# Patient Record
Sex: Male | Born: 1935 | Race: Black or African American | Hispanic: No | Marital: Single
Health system: Southern US, Community
[De-identification: ages and names within clinical notes are randomized; demographics above are authoritative.]

---

## 2013-09-17 ENCOUNTER — Emergency Department: Payer: Self-pay | Admitting: Emergency Medicine

## 2013-09-17 LAB — BASIC METABOLIC PANEL
BUN: 45 mg/dL — ABNORMAL HIGH (ref 7–18)
Calcium, Total: 9.2 mg/dL (ref 8.5–10.1)
Chloride: 110 mmol/L — ABNORMAL HIGH (ref 98–107)
Co2: 23 mmol/L (ref 21–32)
Creatinine: 3.71 mg/dL — ABNORMAL HIGH (ref 0.60–1.30)
EGFR (Non-African Amer.): 15 — ABNORMAL LOW
Osmolality: 297 (ref 275–301)
Potassium: 3.6 mmol/L (ref 3.5–5.1)
Sodium: 141 mmol/L (ref 136–145)

## 2013-09-17 LAB — CBC WITH DIFFERENTIAL/PLATELET
Basophil #: 0 10*3/uL (ref 0.0–0.1)
Eosinophil #: 0 10*3/uL (ref 0.0–0.7)
Eosinophil %: 0 %
HCT: 31.5 % — ABNORMAL LOW (ref 40.0–52.0)
HGB: 10.5 g/dL — ABNORMAL LOW (ref 13.0–18.0)
Lymphocyte %: 7.9 %
MCH: 28.6 pg (ref 26.0–34.0)
MCV: 86 fL (ref 80–100)
Neutrophil #: 5.9 10*3/uL (ref 1.4–6.5)
Neutrophil %: 83.5 %
Platelet: 138 10*3/uL — ABNORMAL LOW (ref 150–440)
RBC: 3.68 10*6/uL — ABNORMAL LOW (ref 4.40–5.90)
WBC: 7 10*3/uL (ref 3.8–10.6)

## 2013-09-17 LAB — URINALYSIS, COMPLETE
Bilirubin,UR: NEGATIVE
Glucose,UR: 50 mg/dL (ref 0–75)
Ketone: NEGATIVE
Leukocyte Esterase: NEGATIVE
Protein: 100
RBC,UR: 5333 /HPF (ref 0–5)

## 2013-09-17 LAB — PROTIME-INR: Prothrombin Time: 33.8 secs — ABNORMAL HIGH (ref 11.5–14.7)

## 2013-12-24 ENCOUNTER — Emergency Department: Payer: Self-pay | Admitting: Emergency Medicine

## 2014-02-21 ENCOUNTER — Ambulatory Visit: Payer: Self-pay | Admitting: Family Medicine

## 2014-02-21 ENCOUNTER — Inpatient Hospital Stay: Payer: Self-pay | Admitting: Internal Medicine

## 2014-02-21 LAB — COMPREHENSIVE METABOLIC PANEL
Albumin: 2.9 g/dL — ABNORMAL LOW (ref 3.4–5.0)
Alkaline Phosphatase: 128 U/L — ABNORMAL HIGH
Anion Gap: 6 — ABNORMAL LOW (ref 7–16)
BUN: 46 mg/dL — ABNORMAL HIGH (ref 7–18)
Bilirubin,Total: 0.6 mg/dL (ref 0.2–1.0)
CALCIUM: 8.6 mg/dL (ref 8.5–10.1)
CHLORIDE: 114 mmol/L — AB (ref 98–107)
CO2: 22 mmol/L (ref 21–32)
CREATININE: 2.77 mg/dL — AB (ref 0.60–1.30)
EGFR (African American): 24 — ABNORMAL LOW
EGFR (Non-African Amer.): 21 — ABNORMAL LOW
Glucose: 145 mg/dL — ABNORMAL HIGH (ref 65–99)
Osmolality: 298 (ref 275–301)
POTASSIUM: 4.7 mmol/L (ref 3.5–5.1)
SGOT(AST): 22 U/L (ref 15–37)
SGPT (ALT): 16 U/L (ref 12–78)
Sodium: 142 mmol/L (ref 136–145)
Total Protein: 7.9 g/dL (ref 6.4–8.2)

## 2014-02-21 LAB — CBC
HCT: 27.9 % — ABNORMAL LOW (ref 40.0–52.0)
HGB: 8.9 g/dL — ABNORMAL LOW (ref 13.0–18.0)
MCH: 27.2 pg (ref 26.0–34.0)
MCHC: 31.8 g/dL — ABNORMAL LOW (ref 32.0–36.0)
MCV: 86 fL (ref 80–100)
Platelet: 192 10*3/uL (ref 150–440)
RBC: 3.25 10*6/uL — ABNORMAL LOW (ref 4.40–5.90)
RDW: 19.6 % — ABNORMAL HIGH (ref 11.5–14.5)
WBC: 6 10*3/uL (ref 3.8–10.6)

## 2014-02-21 LAB — PROTIME-INR
INR: 1.7
Prothrombin Time: 19.2 secs — ABNORMAL HIGH (ref 11.5–14.7)

## 2014-02-21 LAB — APTT: ACTIVATED PTT: 40 s — AB (ref 23.6–35.9)

## 2014-02-22 LAB — BASIC METABOLIC PANEL
Anion Gap: 7 (ref 7–16)
BUN: 48 mg/dL — AB (ref 7–18)
CHLORIDE: 114 mmol/L — AB (ref 98–107)
Calcium, Total: 8.9 mg/dL (ref 8.5–10.1)
Co2: 21 mmol/L (ref 21–32)
Creatinine: 2.67 mg/dL — ABNORMAL HIGH (ref 0.60–1.30)
EGFR (African American): 25 — ABNORMAL LOW
EGFR (Non-African Amer.): 22 — ABNORMAL LOW
GLUCOSE: 165 mg/dL — AB (ref 65–99)
Osmolality: 299 (ref 275–301)
Potassium: 5 mmol/L (ref 3.5–5.1)
Sodium: 142 mmol/L (ref 136–145)

## 2014-02-22 LAB — CBC WITH DIFFERENTIAL/PLATELET
BASOS ABS: 0 10*3/uL (ref 0.0–0.1)
BASOS PCT: 0.8 %
Basophil #: 0 10*3/uL (ref 0.0–0.1)
Basophil %: 0.5 %
EOS ABS: 0 10*3/uL (ref 0.0–0.7)
Eosinophil #: 0 10*3/uL (ref 0.0–0.7)
Eosinophil %: 0.6 %
Eosinophil %: 0.5 %
HCT: 29 % — ABNORMAL LOW (ref 40.0–52.0)
HCT: 27.1 % — AB (ref 40.0–52.0)
HGB: 9.2 g/dL — ABNORMAL LOW (ref 13.0–18.0)
HGB: 8.5 g/dL — ABNORMAL LOW (ref 13.0–18.0)
LYMPHS PCT: 14.3 %
Lymphocyte #: 0.9 10*3/uL — ABNORMAL LOW (ref 1.0–3.6)
Lymphocyte #: 0.7 10*3/uL — ABNORMAL LOW (ref 1.0–3.6)
Lymphocyte %: 12.6 %
MCH: 27.1 pg (ref 26.0–34.0)
MCH: 27.2 pg (ref 26.0–34.0)
MCHC: 31.7 g/dL — AB (ref 32.0–36.0)
MCHC: 31.5 g/dL — ABNORMAL LOW (ref 32.0–36.0)
MCV: 85 fL (ref 80–100)
MCV: 86 fL (ref 80–100)
MONOS PCT: 9.1 %
MONOS PCT: 8.4 %
Monocyte #: 0.6 x10 3/mm (ref 0.2–1.0)
Monocyte #: 0.5 x10 3/mm (ref 0.2–1.0)
NEUTROS ABS: 4.7 10*3/uL (ref 1.4–6.5)
Neutrophil #: 4.3 10*3/uL (ref 1.4–6.5)
Neutrophil %: 75.2 %
Neutrophil %: 78 %
PLATELETS: 171 10*3/uL (ref 150–440)
Platelet: 190 10*3/uL (ref 150–440)
RBC: 3.39 10*6/uL — ABNORMAL LOW (ref 4.40–5.90)
RBC: 3.14 10*6/uL — AB (ref 4.40–5.90)
RDW: 19.5 % — AB (ref 11.5–14.5)
RDW: 19.6 % — AB (ref 11.5–14.5)
WBC: 6.2 10*3/uL (ref 3.8–10.6)
WBC: 5.5 10*3/uL (ref 3.8–10.6)

## 2014-02-22 LAB — APTT
Activated PTT: 42.5 secs — ABNORMAL HIGH (ref 23.6–35.9)
Activated PTT: >160 secs (ref 23.6–35.9)

## 2014-02-23 DIAGNOSIS — I359 Nonrheumatic aortic valve disorder, unspecified: Secondary | ICD-10-CM

## 2014-02-23 LAB — CBC WITH DIFFERENTIAL/PLATELET
BASOS ABS: 0 10*3/uL (ref 0.0–0.1)
Basophil %: 0.5 %
EOS ABS: 0.1 10*3/uL (ref 0.0–0.7)
Eosinophil %: 0.9 %
HCT: 26 % — ABNORMAL LOW (ref 40.0–52.0)
HGB: 8.3 g/dL — ABNORMAL LOW (ref 13.0–18.0)
LYMPHS PCT: 12.6 %
Lymphocyte #: 0.9 10*3/uL — ABNORMAL LOW (ref 1.0–3.6)
MCH: 27.6 pg (ref 26.0–34.0)
MCHC: 31.8 g/dL — ABNORMAL LOW (ref 32.0–36.0)
MCV: 87 fL (ref 80–100)
Monocyte #: 0.6 x10 3/mm (ref 0.2–1.0)
Monocyte %: 9 %
NEUTROS ABS: 5.3 10*3/uL (ref 1.4–6.5)
Neutrophil %: 77 %
PLATELETS: 172 10*3/uL (ref 150–440)
RBC: 3 10*6/uL — ABNORMAL LOW (ref 4.40–5.90)
RDW: 19.3 % — ABNORMAL HIGH (ref 11.5–14.5)
WBC: 6.9 10*3/uL (ref 3.8–10.6)

## 2014-02-23 LAB — PROTIME-INR
INR: 1.9
Prothrombin Time: 21.5 secs — ABNORMAL HIGH (ref 11.5–14.7)

## 2014-02-23 LAB — BASIC METABOLIC PANEL
Anion Gap: 13 (ref 7–16)
BUN: 48 mg/dL — ABNORMAL HIGH (ref 7–18)
CALCIUM: 8.2 mg/dL — AB (ref 8.5–10.1)
CREATININE: 2.61 mg/dL — AB (ref 0.60–1.30)
Chloride: 117 mmol/L — ABNORMAL HIGH (ref 98–107)
Co2: 14 mmol/L — ABNORMAL LOW (ref 21–32)
EGFR (African American): 26 — ABNORMAL LOW
GFR CALC NON AF AMER: 22 — AB
Glucose: 206 mg/dL — ABNORMAL HIGH (ref 65–99)
Osmolality: 305 (ref 275–301)
Potassium: 5.3 mmol/L — ABNORMAL HIGH (ref 3.5–5.1)
SODIUM: 144 mmol/L (ref 136–145)

## 2014-02-23 LAB — APTT: ACTIVATED PTT: 66.6 s — AB (ref 23.6–35.9)

## 2014-02-24 LAB — CBC WITH DIFFERENTIAL/PLATELET
BASOS ABS: 0 10*3/uL (ref 0.0–0.1)
BASOS PCT: 0.2 %
EOS PCT: 0.5 %
Eosinophil #: 0 10*3/uL (ref 0.0–0.7)
HCT: 24.6 % — ABNORMAL LOW (ref 40.0–52.0)
HGB: 8 g/dL — ABNORMAL LOW (ref 13.0–18.0)
LYMPHS ABS: 0.7 10*3/uL — AB (ref 1.0–3.6)
Lymphocyte %: 7.9 %
MCH: 27.5 pg (ref 26.0–34.0)
MCHC: 32.4 g/dL (ref 32.0–36.0)
MCV: 85 fL (ref 80–100)
Monocyte #: 0.8 x10 3/mm (ref 0.2–1.0)
Monocyte %: 9.7 %
Neutrophil #: 6.8 10*3/uL — ABNORMAL HIGH (ref 1.4–6.5)
Neutrophil %: 81.7 %
Platelet: 157 10*3/uL (ref 150–440)
RBC: 2.9 10*6/uL — ABNORMAL LOW (ref 4.40–5.90)
RDW: 18.6 % — ABNORMAL HIGH (ref 11.5–14.5)
WBC: 8.4 10*3/uL (ref 3.8–10.6)

## 2014-02-24 LAB — BASIC METABOLIC PANEL
Anion Gap: 8 (ref 7–16)
BUN: 47 mg/dL — AB (ref 7–18)
CHLORIDE: 114 mmol/L — AB (ref 98–107)
CREATININE: 2.67 mg/dL — AB (ref 0.60–1.30)
Calcium, Total: 8.2 mg/dL — ABNORMAL LOW (ref 8.5–10.1)
Co2: 21 mmol/L (ref 21–32)
EGFR (African American): 25 — ABNORMAL LOW
EGFR (Non-African Amer.): 22 — ABNORMAL LOW
Glucose: 170 mg/dL — ABNORMAL HIGH (ref 65–99)
OSMOLALITY: 301 (ref 275–301)
Potassium: 4.7 mmol/L (ref 3.5–5.1)
SODIUM: 143 mmol/L (ref 136–145)

## 2014-02-24 LAB — APTT
Activated PTT: >160 secs (ref 23.6–35.9)
Activated PTT: 119.4 secs — ABNORMAL HIGH (ref 23.6–35.9)
Activated PTT: 83.6 secs — ABNORMAL HIGH (ref 23.6–35.9)

## 2014-02-24 LAB — PATHOLOGY REPORT

## 2014-02-24 LAB — SEDIMENTATION RATE: ERYTHROCYTE SED RATE: 43 mm/h — AB (ref 0–20)

## 2014-02-25 LAB — CBC WITH DIFFERENTIAL/PLATELET
BASOS PCT: 0.5 %
Basophil #: 0 10*3/uL (ref 0.0–0.1)
EOS PCT: 0.6 %
Eosinophil #: 0 10*3/uL (ref 0.0–0.7)
HCT: 22.5 % — ABNORMAL LOW (ref 40.0–52.0)
HGB: 7.2 g/dL — ABNORMAL LOW (ref 13.0–18.0)
LYMPHS ABS: 1.1 10*3/uL (ref 1.0–3.6)
LYMPHS PCT: 15.6 %
MCH: 27.4 pg (ref 26.0–34.0)
MCHC: 32 g/dL (ref 32.0–36.0)
MCV: 86 fL (ref 80–100)
MONO ABS: 0.8 x10 3/mm (ref 0.2–1.0)
MONOS PCT: 11.4 %
NEUTROS PCT: 71.9 %
Neutrophil #: 4.9 10*3/uL (ref 1.4–6.5)
Platelet: 142 10*3/uL — ABNORMAL LOW (ref 150–440)
RBC: 2.63 10*6/uL — ABNORMAL LOW (ref 4.40–5.90)
RDW: 19.3 % — ABNORMAL HIGH (ref 11.5–14.5)
WBC: 6.8 10*3/uL (ref 3.8–10.6)

## 2014-02-25 LAB — BASIC METABOLIC PANEL
Anion Gap: 8 (ref 7–16)
BUN: 55 mg/dL — ABNORMAL HIGH (ref 7–18)
CALCIUM: 8.5 mg/dL (ref 8.5–10.1)
CHLORIDE: 109 mmol/L — AB (ref 98–107)
Co2: 22 mmol/L (ref 21–32)
Creatinine: 3.47 mg/dL — ABNORMAL HIGH (ref 0.60–1.30)
EGFR (African American): 18 — ABNORMAL LOW
EGFR (Non-African Amer.): 16 — ABNORMAL LOW
GLUCOSE: 173 mg/dL — AB (ref 65–99)
OSMOLALITY: 297 (ref 275–301)
Potassium: 4.6 mmol/L (ref 3.5–5.1)
Sodium: 139 mmol/L (ref 136–145)

## 2014-02-25 LAB — PROTIME-INR
INR: 1.9
PROTHROMBIN TIME: 21 s — AB (ref 11.5–14.7)

## 2014-02-25 LAB — VANCOMYCIN, TROUGH: Vancomycin, Trough: 17 ug/mL (ref 10–20)

## 2014-02-26 LAB — CULTURE, BLOOD (SINGLE)

## 2014-02-26 LAB — BASIC METABOLIC PANEL
Anion Gap: 9 (ref 7–16)
BUN: 62 mg/dL — ABNORMAL HIGH (ref 7–18)
CO2: 21 mmol/L (ref 21–32)
CREATININE: 3.89 mg/dL — AB (ref 0.60–1.30)
Calcium, Total: 8.4 mg/dL — ABNORMAL LOW (ref 8.5–10.1)
Chloride: 109 mmol/L — ABNORMAL HIGH (ref 98–107)
EGFR (African American): 16 — ABNORMAL LOW
EGFR (Non-African Amer.): 14 — ABNORMAL LOW
GLUCOSE: 143 mg/dL — AB (ref 65–99)
OSMOLALITY: 298 (ref 275–301)
POTASSIUM: 5 mmol/L (ref 3.5–5.1)
Sodium: 139 mmol/L (ref 136–145)

## 2014-02-26 LAB — CBC WITH DIFFERENTIAL/PLATELET
BASOS ABS: 0 10*3/uL (ref 0.0–0.1)
Basophil %: 0.4 %
EOS ABS: 0.1 10*3/uL (ref 0.0–0.7)
Eosinophil %: 1 %
HCT: 25.7 % — AB (ref 40.0–52.0)
HGB: 8.1 g/dL — ABNORMAL LOW (ref 13.0–18.0)
Lymphocyte #: 1 10*3/uL (ref 1.0–3.6)
Lymphocyte %: 14.6 %
MCH: 27.1 pg (ref 26.0–34.0)
MCHC: 31.6 g/dL — ABNORMAL LOW (ref 32.0–36.0)
MCV: 86 fL (ref 80–100)
MONO ABS: 0.7 x10 3/mm (ref 0.2–1.0)
MONOS PCT: 10.5 %
Neutrophil #: 4.9 10*3/uL (ref 1.4–6.5)
Neutrophil %: 73.5 %
PLATELETS: 146 10*3/uL — AB (ref 150–440)
RBC: 2.99 10*6/uL — AB (ref 4.40–5.90)
RDW: 19.1 % — AB (ref 11.5–14.5)
WBC: 6.6 10*3/uL (ref 3.8–10.6)

## 2014-02-26 LAB — PROTIME-INR
INR: 1.9
PROTHROMBIN TIME: 21.2 s — AB (ref 11.5–14.7)

## 2014-02-26 LAB — APTT: Activated PTT: 100.8 secs — ABNORMAL HIGH (ref 23.6–35.9)

## 2014-02-27 LAB — OCCULT BLOOD X 1 CARD TO LAB, STOOL: Occult Blood, Feces: POSITIVE

## 2014-02-27 LAB — BASIC METABOLIC PANEL
Anion Gap: 10 (ref 7–16)
BUN: 70 mg/dL — ABNORMAL HIGH (ref 7–18)
CO2: 19 mmol/L — AB (ref 21–32)
CREATININE: 4.16 mg/dL — AB (ref 0.60–1.30)
Calcium, Total: 8.5 mg/dL (ref 8.5–10.1)
Chloride: 108 mmol/L — ABNORMAL HIGH (ref 98–107)
EGFR (Non-African Amer.): 13 — ABNORMAL LOW
GFR CALC AF AMER: 15 — AB
Glucose: 146 mg/dL — ABNORMAL HIGH (ref 65–99)
Osmolality: 297 (ref 275–301)
POTASSIUM: 5.2 mmol/L — AB (ref 3.5–5.1)
SODIUM: 137 mmol/L (ref 136–145)

## 2014-02-27 LAB — PROTIME-INR
INR: 1.9
Prothrombin Time: 21.4 secs — ABNORMAL HIGH (ref 11.5–14.7)

## 2014-02-27 LAB — CBC WITH DIFFERENTIAL/PLATELET
Basophil #: 0 10*3/uL (ref 0.0–0.1)
Basophil %: 0.5 %
Eosinophil #: 0.1 10*3/uL (ref 0.0–0.7)
Eosinophil %: 1 %
HCT: 25.5 % — AB (ref 40.0–52.0)
HGB: 8 g/dL — AB (ref 13.0–18.0)
LYMPHS ABS: 0.8 10*3/uL — AB (ref 1.0–3.6)
LYMPHS PCT: 13.4 %
MCH: 26.9 pg (ref 26.0–34.0)
MCHC: 31.6 g/dL — AB (ref 32.0–36.0)
MCV: 85 fL (ref 80–100)
Monocyte #: 0.7 x10 3/mm (ref 0.2–1.0)
Monocyte %: 11.2 %
Neutrophil #: 4.5 10*3/uL (ref 1.4–6.5)
Neutrophil %: 73.9 %
Platelet: 142 10*3/uL — ABNORMAL LOW (ref 150–440)
RBC: 2.99 10*6/uL — AB (ref 4.40–5.90)
RDW: 19.2 % — ABNORMAL HIGH (ref 11.5–14.5)
WBC: 6.1 10*3/uL (ref 3.8–10.6)

## 2014-02-27 LAB — APTT
ACTIVATED PTT: 113.8 s — AB (ref 23.6–35.9)
ACTIVATED PTT: 67.2 s — AB (ref 23.6–35.9)
Activated PTT: >160 secs (ref 23.6–35.9)

## 2014-02-28 ENCOUNTER — Ambulatory Visit: Payer: Self-pay

## 2014-02-28 LAB — BASIC METABOLIC PANEL
Anion Gap: 11 (ref 7–16)
BUN: 71 mg/dL — ABNORMAL HIGH (ref 7–18)
CALCIUM: 7.7 mg/dL — AB (ref 8.5–10.1)
CHLORIDE: 114 mmol/L — AB (ref 98–107)
Co2: 17 mmol/L — ABNORMAL LOW (ref 21–32)
Creatinine: 3.83 mg/dL — ABNORMAL HIGH (ref 0.60–1.30)
EGFR (African American): 16 — ABNORMAL LOW
GFR CALC NON AF AMER: 14 — AB
Glucose: 180 mg/dL — ABNORMAL HIGH (ref 65–99)
OSMOLALITY: 308 (ref 275–301)
POTASSIUM: 4.8 mmol/L (ref 3.5–5.1)
Sodium: 142 mmol/L (ref 136–145)

## 2014-02-28 LAB — CBC WITH DIFFERENTIAL/PLATELET
BASOS PCT: 0.5 %
Basophil #: 0 10*3/uL (ref 0.0–0.1)
Eosinophil #: 0 10*3/uL (ref 0.0–0.7)
Eosinophil %: 0.6 %
HCT: 26.7 % — AB (ref 40.0–52.0)
HGB: 8.5 g/dL — ABNORMAL LOW (ref 13.0–18.0)
Lymphocyte #: 0.7 10*3/uL — ABNORMAL LOW (ref 1.0–3.6)
Lymphocyte %: 10.9 %
MCH: 27.3 pg (ref 26.0–34.0)
MCHC: 32 g/dL (ref 32.0–36.0)
MCV: 86 fL (ref 80–100)
MONO ABS: 0.7 x10 3/mm (ref 0.2–1.0)
Monocyte %: 10.3 %
Neutrophil #: 4.9 10*3/uL (ref 1.4–6.5)
Neutrophil %: 77.7 %
Platelet: 139 10*3/uL — ABNORMAL LOW (ref 150–440)
RBC: 3.12 10*6/uL — ABNORMAL LOW (ref 4.40–5.90)
RDW: 19.2 % — AB (ref 11.5–14.5)
WBC: 6.3 10*3/uL (ref 3.8–10.6)

## 2014-02-28 LAB — LIPID PANEL
Cholesterol: 110 mg/dL (ref 0–200)
HDL: 54 mg/dL (ref 40–60)
Ldl Cholesterol, Calc: 44 mg/dL (ref 0–100)
Triglycerides: 58 mg/dL (ref 0–200)
VLDL CHOLESTEROL, CALC: 12 mg/dL (ref 5–40)

## 2014-02-28 LAB — PROTIME-INR
INR: 2
Prothrombin Time: 22 secs — ABNORMAL HIGH (ref 11.5–14.7)

## 2014-02-28 LAB — APTT: Activated PTT: 65.5 secs — ABNORMAL HIGH (ref 23.6–35.9)

## 2014-02-28 LAB — PHOSPHORUS: PHOSPHORUS: 4.8 mg/dL (ref 2.5–4.9)

## 2014-02-28 LAB — WOUND CULTURE

## 2014-03-01 LAB — CBC WITH DIFFERENTIAL/PLATELET
BASOS PCT: 0.7 %
Basophil #: 0 10*3/uL (ref 0.0–0.1)
EOS ABS: 0.1 10*3/uL (ref 0.0–0.7)
Eosinophil %: 1.3 %
HCT: 24.6 % — ABNORMAL LOW (ref 40.0–52.0)
HGB: 7.7 g/dL — ABNORMAL LOW (ref 13.0–18.0)
Lymphocyte #: 0.6 10*3/uL — ABNORMAL LOW (ref 1.0–3.6)
Lymphocyte %: 12.1 %
MCH: 26.7 pg (ref 26.0–34.0)
MCHC: 31.3 g/dL — AB (ref 32.0–36.0)
MCV: 85 fL (ref 80–100)
Monocyte #: 0.7 x10 3/mm (ref 0.2–1.0)
Monocyte %: 12.9 %
Neutrophil #: 3.8 10*3/uL (ref 1.4–6.5)
Neutrophil %: 73 %
Platelet: 132 10*3/uL — ABNORMAL LOW (ref 150–440)
RBC: 2.88 10*6/uL — AB (ref 4.40–5.90)
RDW: 19.4 % — AB (ref 11.5–14.5)
WBC: 5.2 10*3/uL (ref 3.8–10.6)

## 2014-03-01 LAB — BASIC METABOLIC PANEL
ANION GAP: 7 (ref 7–16)
BUN: 72 mg/dL — AB (ref 7–18)
CO2: 24 mmol/L (ref 21–32)
CREATININE: 3.71 mg/dL — AB (ref 0.60–1.30)
Calcium, Total: 8.4 mg/dL — ABNORMAL LOW (ref 8.5–10.1)
Chloride: 110 mmol/L — ABNORMAL HIGH (ref 98–107)
EGFR (African American): 17 — ABNORMAL LOW
GFR CALC NON AF AMER: 15 — AB
GLUCOSE: 123 mg/dL — AB (ref 65–99)
Osmolality: 304 (ref 275–301)
POTASSIUM: 4.7 mmol/L (ref 3.5–5.1)
Sodium: 141 mmol/L (ref 136–145)

## 2014-03-01 LAB — IRON AND TIBC
IRON SATURATION: 8 %
Iron Bind.Cap.(Total): 322 ug/dL (ref 250–450)
Iron: 25 ug/dL — ABNORMAL LOW (ref 65–175)
UNBOUND IRON-BIND. CAP.: 297 ug/dL

## 2014-03-01 LAB — ALBUMIN: ALBUMIN: 2.5 g/dL — AB (ref 3.4–5.0)

## 2014-03-01 LAB — PROTIME-INR
INR: 2.4
Prothrombin Time: 25.9 secs — ABNORMAL HIGH (ref 11.5–14.7)

## 2014-03-01 LAB — FERRITIN: Ferritin (ARMC): 67 ng/mL (ref 8–388)

## 2014-03-02 DIAGNOSIS — I4949 Other premature depolarization: Secondary | ICD-10-CM

## 2014-03-02 DIAGNOSIS — I4891 Unspecified atrial fibrillation: Secondary | ICD-10-CM

## 2014-03-02 LAB — CBC WITH DIFFERENTIAL/PLATELET
BASOS ABS: 0 10*3/uL (ref 0.0–0.1)
Basophil %: 0.5 %
Eosinophil #: 0.1 10*3/uL (ref 0.0–0.7)
Eosinophil %: 0.9 %
HCT: 26.1 % — ABNORMAL LOW (ref 40.0–52.0)
HGB: 8.6 g/dL — ABNORMAL LOW (ref 13.0–18.0)
LYMPHS ABS: 1 10*3/uL (ref 1.0–3.6)
LYMPHS PCT: 15 %
MCH: 27.8 pg (ref 26.0–34.0)
MCHC: 33 g/dL (ref 32.0–36.0)
MCV: 84 fL (ref 80–100)
MONO ABS: 1 x10 3/mm (ref 0.2–1.0)
MONOS PCT: 15.6 %
NEUTROS PCT: 68 %
Neutrophil #: 4.4 10*3/uL (ref 1.4–6.5)
Platelet: 113 10*3/uL — ABNORMAL LOW (ref 150–440)
RBC: 3.1 10*6/uL — ABNORMAL LOW (ref 4.40–5.90)
RDW: 18.1 % — AB (ref 11.5–14.5)
WBC: 6.4 10*3/uL (ref 3.8–10.6)

## 2014-03-02 LAB — BASIC METABOLIC PANEL
Anion Gap: 9 (ref 7–16)
BUN: 56 mg/dL — AB (ref 7–18)
CALCIUM: 8.4 mg/dL — AB (ref 8.5–10.1)
CHLORIDE: 108 mmol/L — AB (ref 98–107)
CO2: 24 mmol/L (ref 21–32)
Creatinine: 3.49 mg/dL — ABNORMAL HIGH (ref 0.60–1.30)
GFR CALC AF AMER: 18 — AB
GFR CALC NON AF AMER: 16 — AB
Glucose: 133 mg/dL — ABNORMAL HIGH (ref 65–99)
OSMOLALITY: 299 (ref 275–301)
POTASSIUM: 4.3 mmol/L (ref 3.5–5.1)
SODIUM: 141 mmol/L (ref 136–145)

## 2014-03-02 LAB — MAGNESIUM: MAGNESIUM: 2.2 mg/dL

## 2014-03-02 LAB — PROTIME-INR
INR: 2.9
PROTHROMBIN TIME: 29.2 s — AB (ref 11.5–14.7)

## 2014-03-02 LAB — PHOSPHORUS: Phosphorus: 3.6 mg/dL (ref 2.5–4.9)

## 2014-03-03 LAB — CBC WITH DIFFERENTIAL/PLATELET
Basophil #: 0 10*3/uL (ref 0.0–0.1)
Basophil %: 0.5 %
Eosinophil #: 0.1 10*3/uL (ref 0.0–0.7)
Eosinophil %: 1.7 %
HCT: 26 % — AB (ref 40.0–52.0)
HGB: 8.5 g/dL — ABNORMAL LOW (ref 13.0–18.0)
Lymphocyte #: 0.7 10*3/uL — ABNORMAL LOW (ref 1.0–3.6)
Lymphocyte %: 11.1 %
MCH: 27.8 pg (ref 26.0–34.0)
MCHC: 32.5 g/dL (ref 32.0–36.0)
MCV: 85 fL (ref 80–100)
MONOS PCT: 15.5 %
Monocyte #: 1 x10 3/mm (ref 0.2–1.0)
Neutrophil #: 4.8 10*3/uL (ref 1.4–6.5)
Neutrophil %: 71.2 %
Platelet: 104 10*3/uL — ABNORMAL LOW (ref 150–440)
RBC: 3.05 10*6/uL — ABNORMAL LOW (ref 4.40–5.90)
RDW: 18.4 % — ABNORMAL HIGH (ref 11.5–14.5)
WBC: 6.7 10*3/uL (ref 3.8–10.6)

## 2014-03-03 LAB — BASIC METABOLIC PANEL
ANION GAP: 5 — AB (ref 7–16)
BUN: 40 mg/dL — ABNORMAL HIGH (ref 7–18)
CALCIUM: 8.2 mg/dL — AB (ref 8.5–10.1)
Chloride: 105 mmol/L (ref 98–107)
Co2: 29 mmol/L (ref 21–32)
Creatinine: 2.94 mg/dL — ABNORMAL HIGH (ref 0.60–1.30)
EGFR (African American): 22 — ABNORMAL LOW
GFR CALC NON AF AMER: 19 — AB
Glucose: 126 mg/dL — ABNORMAL HIGH (ref 65–99)
OSMOLALITY: 289 (ref 275–301)
Potassium: 4.1 mmol/L (ref 3.5–5.1)
Sodium: 139 mmol/L (ref 136–145)

## 2014-03-03 LAB — PROTIME-INR
INR: 2.6
Prothrombin Time: 27.3 secs — ABNORMAL HIGH (ref 11.5–14.7)

## 2014-03-04 LAB — CBC WITH DIFFERENTIAL/PLATELET
Basophil #: 0 10*3/uL (ref 0.0–0.1)
Basophil %: 0.3 %
EOS ABS: 0.1 10*3/uL (ref 0.0–0.7)
Eosinophil %: 0.8 %
HCT: 26.1 % — ABNORMAL LOW (ref 40.0–52.0)
HGB: 8.5 g/dL — ABNORMAL LOW (ref 13.0–18.0)
Lymphocyte #: 0.5 10*3/uL — ABNORMAL LOW (ref 1.0–3.6)
Lymphocyte %: 6.6 %
MCH: 28 pg (ref 26.0–34.0)
MCHC: 32.7 g/dL (ref 32.0–36.0)
MCV: 86 fL (ref 80–100)
MONO ABS: 1 x10 3/mm (ref 0.2–1.0)
Monocyte %: 12.1 %
NEUTROS ABS: 6.5 10*3/uL (ref 1.4–6.5)
NEUTROS PCT: 80.2 %
Platelet: 106 10*3/uL — ABNORMAL LOW (ref 150–440)
RBC: 3.05 10*6/uL — ABNORMAL LOW (ref 4.40–5.90)
RDW: 18.8 % — ABNORMAL HIGH (ref 11.5–14.5)
WBC: 8.2 10*3/uL (ref 3.8–10.6)

## 2014-03-04 LAB — POTASSIUM: Potassium: 3.6 mmol/L (ref 3.5–5.1)

## 2014-03-04 LAB — BASIC METABOLIC PANEL
Anion Gap: 6 — ABNORMAL LOW (ref 7–16)
BUN: 34 mg/dL — AB (ref 7–18)
CO2: 30 mmol/L (ref 21–32)
Calcium, Total: 8.6 mg/dL (ref 8.5–10.1)
Chloride: 102 mmol/L (ref 98–107)
Creatinine: 3 mg/dL — ABNORMAL HIGH (ref 0.60–1.30)
EGFR (African American): 22 — ABNORMAL LOW
GFR CALC NON AF AMER: 19 — AB
GLUCOSE: 218 mg/dL — AB (ref 65–99)
OSMOLALITY: 290 (ref 275–301)
Potassium: 3.9 mmol/L (ref 3.5–5.1)
SODIUM: 138 mmol/L (ref 136–145)

## 2014-03-04 LAB — MAGNESIUM: Magnesium: 2 mg/dL

## 2014-03-04 LAB — PROTIME-INR
INR: 1.8
PROTHROMBIN TIME: 20.4 s — AB (ref 11.5–14.7)

## 2014-03-04 LAB — PHOSPHORUS: PHOSPHORUS: 3.1 mg/dL (ref 2.5–4.9)

## 2014-03-05 LAB — PROTIME-INR
INR: 1.4
PROTHROMBIN TIME: 17.3 s — AB (ref 11.5–14.7)

## 2014-03-06 LAB — BASIC METABOLIC PANEL
Anion Gap: 5 — ABNORMAL LOW (ref 7–16)
BUN: 38 mg/dL — ABNORMAL HIGH (ref 7–18)
Calcium, Total: 8.5 mg/dL (ref 8.5–10.1)
Chloride: 101 mmol/L (ref 98–107)
Co2: 32 mmol/L (ref 21–32)
Creatinine: 4.21 mg/dL — ABNORMAL HIGH (ref 0.60–1.30)
EGFR (African American): 15 — ABNORMAL LOW
EGFR (Non-African Amer.): 13 — ABNORMAL LOW
Glucose: 138 mg/dL — ABNORMAL HIGH (ref 65–99)
OSMOLALITY: 287 (ref 275–301)
Potassium: 3.8 mmol/L (ref 3.5–5.1)
SODIUM: 138 mmol/L (ref 136–145)

## 2014-03-06 LAB — PROTIME-INR
INR: 1.4
PROTHROMBIN TIME: 17.2 s — AB (ref 11.5–14.7)

## 2014-03-07 ENCOUNTER — Ambulatory Visit: Payer: Self-pay

## 2014-03-07 LAB — PROTIME-INR
INR: 1.4
Prothrombin Time: 17.3 secs — ABNORMAL HIGH (ref 11.5–14.7)

## 2014-03-07 LAB — CLOSTRIDIUM DIFFICILE(ARMC)

## 2014-03-07 LAB — HEMOGLOBIN: HGB: 8.4 g/dL — ABNORMAL LOW (ref 13.0–18.0)

## 2014-03-08 LAB — BASIC METABOLIC PANEL
ANION GAP: 6 — AB (ref 7–16)
BUN: 39 mg/dL — ABNORMAL HIGH (ref 7–18)
CHLORIDE: 101 mmol/L (ref 98–107)
Calcium, Total: 8.5 mg/dL (ref 8.5–10.1)
Co2: 33 mmol/L — ABNORMAL HIGH (ref 21–32)
Creatinine: 3.87 mg/dL — ABNORMAL HIGH (ref 0.60–1.30)
EGFR (African American): 16 — ABNORMAL LOW
EGFR (Non-African Amer.): 14 — ABNORMAL LOW
Glucose: 154 mg/dL — ABNORMAL HIGH (ref 65–99)
Osmolality: 292 (ref 275–301)
Potassium: 3.7 mmol/L (ref 3.5–5.1)
Sodium: 140 mmol/L (ref 136–145)

## 2014-03-08 LAB — PROTIME-INR
INR: 1.7
Prothrombin Time: 19.2 secs — ABNORMAL HIGH (ref 11.5–14.7)

## 2014-03-08 LAB — HEMOGLOBIN: HGB: 8.8 g/dL — ABNORMAL LOW (ref 13.0–18.0)

## 2014-03-09 LAB — CBC WITH DIFFERENTIAL/PLATELET
BASOS PCT: 0.8 %
Basophil #: 0.1 10*3/uL (ref 0.0–0.1)
Eosinophil #: 0.3 10*3/uL (ref 0.0–0.7)
Eosinophil %: 4 %
HCT: 26.1 % — AB (ref 40.0–52.0)
HGB: 8.2 g/dL — AB (ref 13.0–18.0)
Lymphocyte #: 0.8 10*3/uL — ABNORMAL LOW (ref 1.0–3.6)
Lymphocyte %: 10.1 %
MCH: 27.1 pg (ref 26.0–34.0)
MCHC: 31.5 g/dL — ABNORMAL LOW (ref 32.0–36.0)
MCV: 86 fL (ref 80–100)
Monocyte #: 0.7 x10 3/mm (ref 0.2–1.0)
Monocyte %: 9.1 %
NEUTROS ABS: 5.7 10*3/uL (ref 1.4–6.5)
NEUTROS PCT: 76 %
PLATELETS: 153 10*3/uL (ref 150–440)
RBC: 3.04 10*6/uL — ABNORMAL LOW (ref 4.40–5.90)
RDW: 19.4 % — ABNORMAL HIGH (ref 11.5–14.5)
WBC: 7.5 10*3/uL (ref 3.8–10.6)

## 2014-03-09 LAB — PRO B NATRIURETIC PEPTIDE: B-Type Natriuretic Peptide: 6002 pg/mL — ABNORMAL HIGH (ref 0–450)

## 2014-03-09 LAB — PROTIME-INR
INR: 2.1
Prothrombin Time: 23.1 secs — ABNORMAL HIGH (ref 11.5–14.7)

## 2014-03-09 LAB — PHOSPHORUS: Phosphorus: 3.6 mg/dL (ref 2.5–4.9)

## 2014-03-10 LAB — BASIC METABOLIC PANEL
Anion Gap: 5 — ABNORMAL LOW (ref 7–16)
BUN: 27 mg/dL — ABNORMAL HIGH (ref 7–18)
CALCIUM: 8.2 mg/dL — AB (ref 8.5–10.1)
CHLORIDE: 100 mmol/L (ref 98–107)
CO2: 35 mmol/L — AB (ref 21–32)
CREATININE: 2.88 mg/dL — AB (ref 0.60–1.30)
EGFR (African American): 23 — ABNORMAL LOW
EGFR (Non-African Amer.): 20 — ABNORMAL LOW
Glucose: 159 mg/dL — ABNORMAL HIGH (ref 65–99)
Osmolality: 288 (ref 275–301)
Potassium: 3.6 mmol/L (ref 3.5–5.1)
Sodium: 140 mmol/L (ref 136–145)

## 2014-03-10 LAB — PROTIME-INR
INR: 2.1
Prothrombin Time: 23.3 secs — ABNORMAL HIGH (ref 11.5–14.7)

## 2014-03-11 LAB — BASIC METABOLIC PANEL
ANION GAP: 6 — AB (ref 7–16)
BUN: 35 mg/dL — AB (ref 7–18)
CHLORIDE: 100 mmol/L (ref 98–107)
CO2: 34 mmol/L — AB (ref 21–32)
CREATININE: 3.54 mg/dL — AB (ref 0.60–1.30)
Calcium, Total: 8.3 mg/dL — ABNORMAL LOW (ref 8.5–10.1)
EGFR (African American): 18 — ABNORMAL LOW
EGFR (Non-African Amer.): 15 — ABNORMAL LOW
Glucose: 154 mg/dL — ABNORMAL HIGH (ref 65–99)
Osmolality: 290 (ref 275–301)
Potassium: 3.7 mmol/L (ref 3.5–5.1)
Sodium: 140 mmol/L (ref 136–145)

## 2014-03-11 LAB — PROTIME-INR
INR: 2.3
Prothrombin Time: 24.8 secs — ABNORMAL HIGH (ref 11.5–14.7)

## 2014-03-11 LAB — HEMOGLOBIN: HGB: 8.1 g/dL — AB (ref 13.0–18.0)

## 2014-03-12 LAB — PROTIME-INR
INR: 2.6
Prothrombin Time: 27.3 secs — ABNORMAL HIGH (ref 11.5–14.7)

## 2014-03-12 LAB — CREATININE, SERUM
CREATININE: 3.54 mg/dL — AB (ref 0.60–1.30)
EGFR (African American): 18 — ABNORMAL LOW
EGFR (Non-African Amer.): 15 — ABNORMAL LOW

## 2014-03-12 LAB — HEMOGLOBIN: HGB: 8 g/dL — AB (ref 13.0–18.0)

## 2014-03-13 ENCOUNTER — Encounter: Payer: Self-pay | Admitting: Internal Medicine

## 2014-03-13 LAB — BASIC METABOLIC PANEL
ANION GAP: 5 — AB (ref 7–16)
BUN: 49 mg/dL — AB (ref 7–18)
CALCIUM: 8.3 mg/dL — AB (ref 8.5–10.1)
CO2: 33 mmol/L — AB (ref 21–32)
CREATININE: 3.66 mg/dL — AB (ref 0.60–1.30)
Chloride: 100 mmol/L (ref 98–107)
EGFR (Non-African Amer.): 15 — ABNORMAL LOW
GFR CALC AF AMER: 17 — AB
Glucose: 124 mg/dL — ABNORMAL HIGH (ref 65–99)
OSMOLALITY: 290 (ref 275–301)
POTASSIUM: 4 mmol/L (ref 3.5–5.1)
SODIUM: 138 mmol/L (ref 136–145)

## 2014-03-13 LAB — PROTIME-INR
INR: 2.9
PROTHROMBIN TIME: 29.4 s — AB (ref 11.5–14.7)

## 2014-03-17 LAB — BASIC METABOLIC PANEL
Anion Gap: 6 — ABNORMAL LOW (ref 7–16)
BUN: 67 mg/dL — ABNORMAL HIGH (ref 7–18)
CHLORIDE: 104 mmol/L (ref 98–107)
Calcium, Total: 8.3 mg/dL — ABNORMAL LOW (ref 8.5–10.1)
Co2: 29 mmol/L (ref 21–32)
Creatinine: 3.86 mg/dL — ABNORMAL HIGH (ref 0.60–1.30)
EGFR (African American): 16 — ABNORMAL LOW
GFR CALC NON AF AMER: 14 — AB
Glucose: 133 mg/dL — ABNORMAL HIGH (ref 65–99)
OSMOLALITY: 299 (ref 275–301)
POTASSIUM: 4.1 mmol/L (ref 3.5–5.1)
SODIUM: 139 mmol/L (ref 136–145)

## 2014-03-17 LAB — CBC WITH DIFFERENTIAL/PLATELET
Basophil #: 0 10*3/uL (ref 0.0–0.1)
Basophil %: 0.7 %
Eosinophil #: 0.2 10*3/uL (ref 0.0–0.7)
Eosinophil %: 2.6 %
HCT: 23.1 % — ABNORMAL LOW (ref 40.0–52.0)
HGB: 7.5 g/dL — AB (ref 13.0–18.0)
LYMPHS ABS: 1.1 10*3/uL (ref 1.0–3.6)
Lymphocyte %: 19 %
MCH: 27.4 pg (ref 26.0–34.0)
MCHC: 32.4 g/dL (ref 32.0–36.0)
MCV: 84 fL (ref 80–100)
MONOS PCT: 13.5 %
Monocyte #: 0.8 x10 3/mm (ref 0.2–1.0)
NEUTROS ABS: 3.8 10*3/uL (ref 1.4–6.5)
Neutrophil %: 64.2 %
Platelet: 190 10*3/uL (ref 150–440)
RBC: 2.73 10*6/uL — ABNORMAL LOW (ref 4.40–5.90)
RDW: 18.1 % — ABNORMAL HIGH (ref 11.5–14.5)
WBC: 5.9 10*3/uL (ref 3.8–10.6)

## 2014-03-17 LAB — PROTIME-INR
INR: 3.2
PROTHROMBIN TIME: 31.8 s — AB (ref 11.5–14.7)

## 2014-03-17 LAB — HEMOGLOBIN A1C: Hemoglobin A1C: 7.6 % — ABNORMAL HIGH (ref 4.2–6.3)

## 2014-03-19 LAB — CBC WITH DIFFERENTIAL/PLATELET
BASOS PCT: 0.9 %
Basophil #: 0 10*3/uL (ref 0.0–0.1)
EOS ABS: 0.1 10*3/uL (ref 0.0–0.7)
EOS PCT: 2.1 %
HCT: 23.7 % — ABNORMAL LOW (ref 40.0–52.0)
HGB: 7.7 g/dL — ABNORMAL LOW (ref 13.0–18.0)
LYMPHS ABS: 0.9 10*3/uL — AB (ref 1.0–3.6)
Lymphocyte %: 18.6 %
MCH: 27.5 pg (ref 26.0–34.0)
MCHC: 32.7 g/dL (ref 32.0–36.0)
MCV: 84 fL (ref 80–100)
MONO ABS: 0.7 x10 3/mm (ref 0.2–1.0)
Monocyte %: 13.1 %
Neutrophil #: 3.3 10*3/uL (ref 1.4–6.5)
Neutrophil %: 65.3 %
PLATELETS: 202 10*3/uL (ref 150–440)
RBC: 2.82 10*6/uL — ABNORMAL LOW (ref 4.40–5.90)
RDW: 18 % — AB (ref 11.5–14.5)
WBC: 5 10*3/uL (ref 3.8–10.6)

## 2014-03-19 LAB — BASIC METABOLIC PANEL
Anion Gap: 8 (ref 7–16)
BUN: 71 mg/dL — ABNORMAL HIGH (ref 7–18)
CHLORIDE: 102 mmol/L (ref 98–107)
CO2: 27 mmol/L (ref 21–32)
CREATININE: 3.85 mg/dL — AB (ref 0.60–1.30)
Calcium, Total: 8.3 mg/dL — ABNORMAL LOW (ref 8.5–10.1)
EGFR (Non-African Amer.): 14 — ABNORMAL LOW
GFR CALC AF AMER: 16 — AB
Glucose: 164 mg/dL — ABNORMAL HIGH (ref 65–99)
Osmolality: 298 (ref 275–301)
Potassium: 4 mmol/L (ref 3.5–5.1)
Sodium: 137 mmol/L (ref 136–145)

## 2014-03-19 LAB — PROTIME-INR
INR: 2.9
Prothrombin Time: 29.7 secs — ABNORMAL HIGH (ref 11.5–14.7)

## 2014-03-20 LAB — URINALYSIS, COMPLETE
Specific Gravity: 1.009 (ref 1.003–1.030)
Squamous Epithelial: NONE SEEN

## 2014-03-23 LAB — URINE CULTURE

## 2014-03-24 LAB — UR PROT ELECTROPHORESIS, URINE RANDOM

## 2014-03-30 ENCOUNTER — Ambulatory Visit: Payer: Self-pay | Admitting: Oncology

## 2014-04-12 ENCOUNTER — Inpatient Hospital Stay: Payer: Self-pay | Admitting: Internal Medicine

## 2014-04-12 LAB — CBC
HCT: 16.9 % — ABNORMAL LOW (ref 40.0–52.0)
HCT: 17.2 % — ABNORMAL LOW (ref 40.0–52.0)
HGB: 5.4 g/dL — ABNORMAL LOW (ref 13.0–18.0)
HGB: 5.4 g/dL — AB (ref 13.0–18.0)
MCH: 26.4 pg (ref 26.0–34.0)
MCH: 26.5 pg (ref 26.0–34.0)
MCHC: 31.7 g/dL — AB (ref 32.0–36.0)
MCHC: 31.5 g/dL — AB (ref 32.0–36.0)
MCV: 83 fL (ref 80–100)
MCV: 84 fL (ref 80–100)
PLATELETS: 144 10*3/uL — AB (ref 150–440)
Platelet: 141 10*3/uL — ABNORMAL LOW (ref 150–440)
RBC: 2.03 10*6/uL — AB (ref 4.40–5.90)
RBC: 2.04 10*6/uL — ABNORMAL LOW (ref 4.40–5.90)
RDW: 18.6 % — ABNORMAL HIGH (ref 11.5–14.5)
RDW: 18.6 % — ABNORMAL HIGH (ref 11.5–14.5)
WBC: 4.9 10*3/uL (ref 3.8–10.6)
WBC: 5.1 10*3/uL (ref 3.8–10.6)

## 2014-04-12 LAB — COMPREHENSIVE METABOLIC PANEL
ALBUMIN: 2.7 g/dL — AB (ref 3.4–5.0)
Alkaline Phosphatase: 89 U/L
Anion Gap: 8 (ref 7–16)
BUN: 117 mg/dL — AB (ref 7–18)
Bilirubin,Total: 0.4 mg/dL (ref 0.2–1.0)
CO2: 18 mmol/L — AB (ref 21–32)
CREATININE: 4.8 mg/dL — AB (ref 0.60–1.30)
Calcium, Total: 8 mg/dL — ABNORMAL LOW (ref 8.5–10.1)
Chloride: 116 mmol/L — ABNORMAL HIGH (ref 98–107)
EGFR (African American): 12 — ABNORMAL LOW
EGFR (Non-African Amer.): 11 — ABNORMAL LOW
Glucose: 143 mg/dL — ABNORMAL HIGH (ref 65–99)
OSMOLALITY: 323 (ref 275–301)
POTASSIUM: 4.9 mmol/L (ref 3.5–5.1)
SGOT(AST): 21 U/L (ref 15–37)
SGPT (ALT): 11 U/L — ABNORMAL LOW (ref 12–78)
Sodium: 142 mmol/L (ref 136–145)
TOTAL PROTEIN: 7.2 g/dL (ref 6.4–8.2)

## 2014-04-12 LAB — URINALYSIS, COMPLETE
Bacteria: NONE SEEN
Bilirubin,UR: NEGATIVE
KETONE: NEGATIVE
LEUKOCYTE ESTERASE: NEGATIVE
Nitrite: NEGATIVE
PH: 6 (ref 4.5–8.0)
Protein: 100
SQUAMOUS EPITHELIAL: NONE SEEN
Specific Gravity: 1.014 (ref 1.003–1.030)
WBC UR: 7 /HPF (ref 0–5)

## 2014-04-12 LAB — TROPONIN I: Troponin-I: 0.07 ng/mL — ABNORMAL HIGH

## 2014-04-12 LAB — PROTIME-INR
INR: 3.4
PROTHROMBIN TIME: 33.1 s — AB (ref 11.5–14.7)

## 2014-04-12 LAB — PRO B NATRIURETIC PEPTIDE: B-TYPE NATIURETIC PEPTID: 8925 pg/mL — AB (ref 0–450)

## 2014-04-13 DIAGNOSIS — N186 End stage renal disease: Secondary | ICD-10-CM

## 2014-04-13 DIAGNOSIS — I509 Heart failure, unspecified: Secondary | ICD-10-CM

## 2014-04-13 DIAGNOSIS — I5032 Chronic diastolic (congestive) heart failure: Secondary | ICD-10-CM

## 2014-04-13 DIAGNOSIS — Z7901 Long term (current) use of anticoagulants: Secondary | ICD-10-CM

## 2014-04-13 DIAGNOSIS — I4891 Unspecified atrial fibrillation: Secondary | ICD-10-CM

## 2014-04-13 LAB — CBC WITH DIFFERENTIAL/PLATELET
Basophil #: 0 10*3/uL (ref 0.0–0.1)
Basophil %: 0.6 %
EOS PCT: 0.8 %
Eosinophil #: 0 10*3/uL (ref 0.0–0.7)
HCT: 20.9 % — ABNORMAL LOW (ref 40.0–52.0)
HGB: 6.6 g/dL — ABNORMAL LOW (ref 13.0–18.0)
LYMPHS PCT: 14.6 %
Lymphocyte #: 0.6 10*3/uL — ABNORMAL LOW (ref 1.0–3.6)
MCH: 26.8 pg (ref 26.0–34.0)
MCHC: 31.6 g/dL — ABNORMAL LOW (ref 32.0–36.0)
MCV: 85 fL (ref 80–100)
Monocyte #: 0.5 x10 3/mm (ref 0.2–1.0)
Monocyte %: 10.8 %
NEUTROS ABS: 3.2 10*3/uL (ref 1.4–6.5)
NEUTROS PCT: 73.2 %
PLATELETS: 135 10*3/uL — AB (ref 150–440)
RBC: 2.46 10*6/uL — ABNORMAL LOW (ref 4.40–5.90)
RDW: 17.9 % — AB (ref 11.5–14.5)
WBC: 4.4 10*3/uL (ref 3.8–10.6)

## 2014-04-13 LAB — BASIC METABOLIC PANEL
ANION GAP: 7 (ref 7–16)
BUN: 114 mg/dL — ABNORMAL HIGH (ref 7–18)
Calcium, Total: 8 mg/dL — ABNORMAL LOW (ref 8.5–10.1)
Chloride: 115 mmol/L — ABNORMAL HIGH (ref 98–107)
Co2: 19 mmol/L — ABNORMAL LOW (ref 21–32)
Creatinine: 4.67 mg/dL — ABNORMAL HIGH (ref 0.60–1.30)
EGFR (African American): 13 — ABNORMAL LOW
EGFR (Non-African Amer.): 11 — ABNORMAL LOW
GLUCOSE: 167 mg/dL — AB (ref 65–99)
OSMOLALITY: 321 (ref 275–301)
Potassium: 4.9 mmol/L (ref 3.5–5.1)
SODIUM: 141 mmol/L (ref 136–145)

## 2014-04-13 LAB — HEMOGLOBIN A1C: Hemoglobin A1C: 7.3 % — ABNORMAL HIGH (ref 4.2–6.3)

## 2014-04-13 LAB — TROPONIN I: TROPONIN-I: 0.06 ng/mL — AB

## 2014-04-13 LAB — OCCULT BLOOD X 1 CARD TO LAB, STOOL: OCCULT BLOOD, FECES: POSITIVE

## 2014-04-13 LAB — PROTIME-INR
INR: 3
PROTHROMBIN TIME: 30.2 s — AB (ref 11.5–14.7)

## 2014-04-13 LAB — MAGNESIUM: Magnesium: 2.4 mg/dL

## 2014-04-13 LAB — PHOSPHORUS: PHOSPHORUS: 5.5 mg/dL — AB (ref 2.5–4.9)

## 2014-04-14 LAB — CBC WITH DIFFERENTIAL/PLATELET
Basophil #: 0 10*3/uL (ref 0.0–0.1)
Basophil #: 0 10*3/uL (ref 0.0–0.1)
Basophil %: 0.4 %
Basophil %: 0.4 %
EOS PCT: 2.5 %
EOS PCT: 2.2 %
Eosinophil #: 0.1 10*3/uL (ref 0.0–0.7)
Eosinophil #: 0.1 10*3/uL (ref 0.0–0.7)
HCT: 24.5 % — AB (ref 40.0–52.0)
HCT: 24.1 % — ABNORMAL LOW (ref 40.0–52.0)
HGB: 7.8 g/dL — ABNORMAL LOW (ref 13.0–18.0)
HGB: 7.9 g/dL — ABNORMAL LOW (ref 13.0–18.0)
LYMPHS ABS: 0.9 10*3/uL — AB (ref 1.0–3.6)
Lymphocyte #: 0.8 10*3/uL — ABNORMAL LOW (ref 1.0–3.6)
Lymphocyte %: 15 %
Lymphocyte %: 12 %
MCH: 26.6 pg (ref 26.0–34.0)
MCH: 27.6 pg (ref 26.0–34.0)
MCHC: 31.9 g/dL — ABNORMAL LOW (ref 32.0–36.0)
MCHC: 32.9 g/dL (ref 32.0–36.0)
MCV: 83 fL (ref 80–100)
MCV: 84 fL (ref 80–100)
MONOS PCT: 10.7 %
Monocyte #: 0.7 x10 3/mm (ref 0.2–1.0)
Monocyte #: 0.7 x10 3/mm (ref 0.2–1.0)
Monocyte %: 11.7 %
NEUTROS ABS: 4.7 10*3/uL (ref 1.4–6.5)
NEUTROS PCT: 74.7 %
Neutrophil #: 4.1 10*3/uL (ref 1.4–6.5)
Neutrophil %: 70.4 %
PLATELETS: 91 10*3/uL — AB (ref 150–440)
Platelet: 85 10*3/uL — ABNORMAL LOW (ref 150–440)
RBC: 2.94 10*6/uL — ABNORMAL LOW (ref 4.40–5.90)
RBC: 2.87 10*6/uL — ABNORMAL LOW (ref 4.40–5.90)
RDW: 17.8 % — ABNORMAL HIGH (ref 11.5–14.5)
RDW: 18.3 % — ABNORMAL HIGH (ref 11.5–14.5)
WBC: 5.9 10*3/uL (ref 3.8–10.6)
WBC: 6.3 10*3/uL (ref 3.8–10.6)

## 2014-04-14 LAB — RENAL FUNCTION PANEL
ALBUMIN: 2.9 g/dL — AB (ref 3.4–5.0)
Anion Gap: 8 (ref 7–16)
BUN: 98 mg/dL — AB (ref 7–18)
CREATININE: 4.41 mg/dL — AB (ref 0.60–1.30)
Calcium, Total: 7.7 mg/dL — ABNORMAL LOW (ref 8.5–10.1)
Chloride: 113 mmol/L — ABNORMAL HIGH (ref 98–107)
Co2: 20 mmol/L — ABNORMAL LOW (ref 21–32)
EGFR (African American): 14 — ABNORMAL LOW
GFR CALC NON AF AMER: 12 — AB
Glucose: 160 mg/dL — ABNORMAL HIGH (ref 65–99)
Osmolality: 315 (ref 275–301)
PHOSPHORUS: 4.8 mg/dL (ref 2.5–4.9)
Potassium: 4.5 mmol/L (ref 3.5–5.1)
Sodium: 141 mmol/L (ref 136–145)

## 2014-04-14 LAB — CK TOTAL AND CKMB (NOT AT ARMC)
CK, Total: 132 U/L
CK-MB: 3.1 ng/mL (ref 0.5–3.6)

## 2014-04-14 LAB — PROTIME-INR
INR: 4.3
Prothrombin Time: 39.8 secs — ABNORMAL HIGH (ref 11.5–14.7)

## 2014-04-14 LAB — MAGNESIUM: Magnesium: 2.5 mg/dL — ABNORMAL HIGH

## 2014-04-15 LAB — CBC WITH DIFFERENTIAL/PLATELET
BASOS ABS: 0 10*3/uL (ref 0.0–0.1)
Basophil %: 0.5 %
Eosinophil #: 0.2 10*3/uL (ref 0.0–0.7)
Eosinophil %: 2.7 %
HCT: 23.7 % — ABNORMAL LOW (ref 40.0–52.0)
HGB: 7.8 g/dL — ABNORMAL LOW (ref 13.0–18.0)
Lymphocyte #: 1 10*3/uL (ref 1.0–3.6)
Lymphocyte %: 15.7 %
MCH: 27.7 pg (ref 26.0–34.0)
MCHC: 32.8 g/dL (ref 32.0–36.0)
MCV: 84 fL (ref 80–100)
Monocyte #: 0.8 x10 3/mm (ref 0.2–1.0)
Monocyte %: 12.4 %
Neutrophil #: 4.5 10*3/uL (ref 1.4–6.5)
Neutrophil %: 68.7 %
PLATELETS: 51 10*3/uL — AB (ref 150–440)
RBC: 2.81 10*6/uL — ABNORMAL LOW (ref 4.40–5.90)
RDW: 17.7 % — ABNORMAL HIGH (ref 11.5–14.5)
WBC: 6.6 10*3/uL (ref 3.8–10.6)

## 2014-04-15 LAB — PROTIME-INR
INR: 3
Prothrombin Time: 30.5 secs — ABNORMAL HIGH (ref 11.5–14.7)

## 2014-04-16 DIAGNOSIS — N186 End stage renal disease: Secondary | ICD-10-CM

## 2014-04-16 DIAGNOSIS — I5032 Chronic diastolic (congestive) heart failure: Secondary | ICD-10-CM

## 2014-04-16 DIAGNOSIS — I4891 Unspecified atrial fibrillation: Secondary | ICD-10-CM

## 2014-04-16 DIAGNOSIS — I509 Heart failure, unspecified: Secondary | ICD-10-CM

## 2014-04-16 LAB — CBC WITH DIFFERENTIAL/PLATELET
BASOS ABS: 0 10*3/uL (ref 0.0–0.1)
Basophil %: 0.3 %
EOS ABS: 0.1 10*3/uL (ref 0.0–0.7)
Eosinophil %: 1.3 %
HCT: 22.9 % — AB (ref 40.0–52.0)
HGB: 7.4 g/dL — ABNORMAL LOW (ref 13.0–18.0)
Lymphocyte #: 1 10*3/uL (ref 1.0–3.6)
Lymphocyte %: 13.9 %
MCH: 27.3 pg (ref 26.0–34.0)
MCHC: 32.5 g/dL (ref 32.0–36.0)
MCV: 84 fL (ref 80–100)
MONO ABS: 1 x10 3/mm (ref 0.2–1.0)
Monocyte %: 13.7 %
NEUTROS PCT: 70.8 %
Neutrophil #: 5 10*3/uL (ref 1.4–6.5)
Platelet: 41 10*3/uL — ABNORMAL LOW (ref 150–440)
RBC: 2.72 10*6/uL — AB (ref 4.40–5.90)
RDW: 17.9 % — AB (ref 11.5–14.5)
WBC: 7.1 10*3/uL (ref 3.8–10.6)

## 2014-04-16 LAB — PROTIME-INR
INR: 2.5
Prothrombin Time: 26.6 secs — ABNORMAL HIGH (ref 11.5–14.7)

## 2014-04-17 LAB — CBC WITH DIFFERENTIAL/PLATELET
Basophil #: 0 10*3/uL (ref 0.0–0.1)
Basophil #: 0 10*3/uL (ref 0.0–0.1)
Basophil %: 0.4 %
Basophil %: 0.5 %
Eosinophil #: 0.1 10*3/uL (ref 0.0–0.7)
Eosinophil #: 0.1 10*3/uL (ref 0.0–0.7)
Eosinophil %: 1 %
Eosinophil %: 1.7 %
HCT: 20.6 % — ABNORMAL LOW (ref 40.0–52.0)
HCT: 21.2 % — ABNORMAL LOW (ref 40.0–52.0)
HGB: 6.7 g/dL — ABNORMAL LOW (ref 13.0–18.0)
HGB: 6.9 g/dL — AB (ref 13.0–18.0)
LYMPHS ABS: 1.1 10*3/uL (ref 1.0–3.6)
Lymphocyte #: 1 10*3/uL (ref 1.0–3.6)
Lymphocyte %: 16.5 %
Lymphocyte %: 14.5 %
MCH: 27.5 pg (ref 26.0–34.0)
MCH: 27.8 pg (ref 26.0–34.0)
MCHC: 32.5 g/dL (ref 32.0–36.0)
MCHC: 32.7 g/dL (ref 32.0–36.0)
MCV: 85 fL (ref 80–100)
MCV: 85 fL (ref 80–100)
Monocyte #: 1 x10 3/mm (ref 0.2–1.0)
Monocyte #: 0.9 x10 3/mm (ref 0.2–1.0)
Monocyte %: 14.6 %
Monocyte %: 12.2 %
NEUTROS ABS: 4.4 10*3/uL (ref 1.4–6.5)
NEUTROS ABS: 5 10*3/uL (ref 1.4–6.5)
Neutrophil %: 67.5 %
Neutrophil %: 71.1 %
Platelet: 27 10*3/uL — CL (ref 150–440)
Platelet: 32 10*3/uL — ABNORMAL LOW (ref 150–440)
RBC: 2.44 10*6/uL — ABNORMAL LOW (ref 4.40–5.90)
RBC: 2.49 10*6/uL — ABNORMAL LOW (ref 4.40–5.90)
RDW: 18 % — AB (ref 11.5–14.5)
RDW: 17.7 % — AB (ref 11.5–14.5)
WBC: 6.5 10*3/uL (ref 3.8–10.6)
WBC: 7 10*3/uL (ref 3.8–10.6)

## 2014-04-17 LAB — HEMOGLOBIN: HGB: 7.4 g/dL — ABNORMAL LOW (ref 13.0–18.0)

## 2014-04-17 LAB — PHOSPHORUS: PHOSPHORUS: 2.4 mg/dL — AB (ref 2.5–4.9)

## 2014-04-17 LAB — PROTIME-INR
INR: 2.3
Prothrombin Time: 24.7 secs — ABNORMAL HIGH (ref 11.5–14.7)

## 2014-04-17 LAB — POTASSIUM: POTASSIUM: 4 mmol/L (ref 3.5–5.1)

## 2014-04-18 LAB — BASIC METABOLIC PANEL
Anion Gap: 5 — ABNORMAL LOW (ref 7–16)
BUN: 54 mg/dL — ABNORMAL HIGH (ref 7–18)
CHLORIDE: 106 mmol/L (ref 98–107)
CO2: 31 mmol/L (ref 21–32)
CREATININE: 2.8 mg/dL — AB (ref 0.60–1.30)
Calcium, Total: 8.2 mg/dL — ABNORMAL LOW (ref 8.5–10.1)
EGFR (African American): 24 — ABNORMAL LOW
EGFR (Non-African Amer.): 21 — ABNORMAL LOW
Glucose: 126 mg/dL — ABNORMAL HIGH (ref 65–99)
OSMOLALITY: 299 (ref 275–301)
POTASSIUM: 3.8 mmol/L (ref 3.5–5.1)
Sodium: 142 mmol/L (ref 136–145)

## 2014-04-18 LAB — CBC WITH DIFFERENTIAL/PLATELET
Basophil #: 0 10*3/uL (ref 0.0–0.1)
Basophil #: 0 10*3/uL (ref 0.0–0.1)
Basophil %: 0.4 %
Basophil %: 0.5 %
EOS ABS: 0.1 10*3/uL (ref 0.0–0.7)
EOS PCT: 1.7 %
Eosinophil #: 0.2 10*3/uL (ref 0.0–0.7)
Eosinophil %: 2.1 %
HCT: 23.3 % — ABNORMAL LOW (ref 40.0–52.0)
HCT: 22.8 % — ABNORMAL LOW (ref 40.0–52.0)
HGB: 7.5 g/dL — ABNORMAL LOW (ref 13.0–18.0)
HGB: 7.3 g/dL — AB (ref 13.0–18.0)
LYMPHS ABS: 1 10*3/uL (ref 1.0–3.6)
LYMPHS ABS: 1.1 10*3/uL (ref 1.0–3.6)
Lymphocyte %: 13.4 %
Lymphocyte %: 16.6 %
MCH: 27.8 pg (ref 26.0–34.0)
MCH: 27.9 pg (ref 26.0–34.0)
MCHC: 32.3 g/dL (ref 32.0–36.0)
MCHC: 32.3 g/dL (ref 32.0–36.0)
MCV: 86 fL (ref 80–100)
MCV: 87 fL (ref 80–100)
MONO ABS: 0.8 x10 3/mm (ref 0.2–1.0)
MONOS PCT: 11.1 %
MONOS PCT: 12.4 %
Monocyte #: 0.8 x10 3/mm (ref 0.2–1.0)
NEUTROS ABS: 5.5 10*3/uL (ref 1.4–6.5)
NEUTROS PCT: 73 %
Neutrophil #: 4.6 10*3/uL (ref 1.4–6.5)
Neutrophil %: 68.8 %
Platelet: 43 10*3/uL — ABNORMAL LOW (ref 150–440)
Platelet: 43 10*3/uL — ABNORMAL LOW (ref 150–440)
RBC: 2.71 10*6/uL — ABNORMAL LOW (ref 4.40–5.90)
RBC: 2.63 10*6/uL — ABNORMAL LOW (ref 4.40–5.90)
RDW: 17.6 % — ABNORMAL HIGH (ref 11.5–14.5)
RDW: 17.6 % — AB (ref 11.5–14.5)
WBC: 7.6 10*3/uL (ref 3.8–10.6)
WBC: 6.7 10*3/uL (ref 3.8–10.6)

## 2014-04-18 LAB — MAGNESIUM: MAGNESIUM: 2 mg/dL

## 2014-04-18 LAB — CULTURE, BLOOD (SINGLE)

## 2014-04-18 LAB — PROTIME-INR
INR: 1.5
Prothrombin Time: 17.8 secs — ABNORMAL HIGH (ref 11.5–14.7)

## 2014-04-18 LAB — PHOSPHORUS: Phosphorus: 2.1 mg/dL — ABNORMAL LOW (ref 2.5–4.9)

## 2014-04-18 LAB — HEMOGLOBIN: HGB: 7.4 g/dL — ABNORMAL LOW (ref 13.0–18.0)

## 2014-04-19 LAB — CBC WITH DIFFERENTIAL/PLATELET
Basophil #: 0 10*3/uL (ref 0.0–0.1)
Basophil %: 0.4 %
EOS PCT: 1.2 %
Eosinophil #: 0.1 10*3/uL (ref 0.0–0.7)
HCT: 24.5 % — AB (ref 40.0–52.0)
HGB: 8 g/dL — ABNORMAL LOW (ref 13.0–18.0)
Lymphocyte #: 1 10*3/uL (ref 1.0–3.6)
Lymphocyte %: 14.7 %
MCH: 28.2 pg (ref 26.0–34.0)
MCHC: 32.6 g/dL (ref 32.0–36.0)
MCV: 86 fL (ref 80–100)
Monocyte #: 0.8 x10 3/mm (ref 0.2–1.0)
Monocyte %: 10.9 %
Neutrophil #: 5 10*3/uL (ref 1.4–6.5)
Neutrophil %: 72.8 %
Platelet: 39 10*3/uL — ABNORMAL LOW (ref 150–440)
RBC: 2.83 10*6/uL — AB (ref 4.40–5.90)
RDW: 17.6 % — AB (ref 11.5–14.5)
WBC: 6.9 10*3/uL (ref 3.8–10.6)

## 2014-04-19 LAB — BASIC METABOLIC PANEL
Anion Gap: 7 (ref 7–16)
BUN: 36 mg/dL — ABNORMAL HIGH (ref 7–18)
CHLORIDE: 105 mmol/L (ref 98–107)
CREATININE: 2.32 mg/dL — AB (ref 0.60–1.30)
Calcium, Total: 8.5 mg/dL (ref 8.5–10.1)
Co2: 31 mmol/L (ref 21–32)
EGFR (African American): 30 — ABNORMAL LOW
EGFR (Non-African Amer.): 26 — ABNORMAL LOW
Glucose: 125 mg/dL — ABNORMAL HIGH (ref 65–99)
OSMOLALITY: 295 (ref 275–301)
Potassium: 3.9 mmol/L (ref 3.5–5.1)
Sodium: 143 mmol/L (ref 136–145)

## 2014-04-19 LAB — MAGNESIUM: MAGNESIUM: 1.8 mg/dL

## 2014-04-20 DIAGNOSIS — I4891 Unspecified atrial fibrillation: Secondary | ICD-10-CM

## 2014-04-20 DIAGNOSIS — I5032 Chronic diastolic (congestive) heart failure: Secondary | ICD-10-CM

## 2014-04-20 LAB — CBC WITH DIFFERENTIAL/PLATELET
BASOS ABS: 0.1 10*3/uL (ref 0.0–0.1)
BASOS ABS: 0 10*3/uL (ref 0.0–0.1)
BASOS PCT: 0.8 %
BASOS PCT: 0.4 %
EOS ABS: 0.1 10*3/uL (ref 0.0–0.7)
Eosinophil #: 0 10*3/uL (ref 0.0–0.7)
Eosinophil %: 1.6 %
Eosinophil %: 0.5 %
HCT: 26.3 % — AB (ref 40.0–52.0)
HCT: 27.3 % — ABNORMAL LOW (ref 40.0–52.0)
HGB: 8.2 g/dL — ABNORMAL LOW (ref 13.0–18.0)
HGB: 8.4 g/dL — AB (ref 13.0–18.0)
LYMPHS ABS: 0.9 10*3/uL — AB (ref 1.0–3.6)
LYMPHS ABS: 0.3 10*3/uL — AB (ref 1.0–3.6)
LYMPHS PCT: 3.6 %
Lymphocyte %: 13.4 %
MCH: 27.4 pg (ref 26.0–34.0)
MCH: 27.2 pg (ref 26.0–34.0)
MCHC: 31.2 g/dL — ABNORMAL LOW (ref 32.0–36.0)
MCHC: 30.9 g/dL — AB (ref 32.0–36.0)
MCV: 88 fL (ref 80–100)
MCV: 88 fL (ref 80–100)
MONO ABS: 0.7 x10 3/mm (ref 0.2–1.0)
MONOS PCT: 11.2 %
MONOS PCT: 8.2 %
Monocyte #: 0.8 x10 3/mm (ref 0.2–1.0)
NEUTROS ABS: 4.8 10*3/uL (ref 1.4–6.5)
NEUTROS ABS: 8.1 10*3/uL — AB (ref 1.4–6.5)
NEUTROS PCT: 73 %
Neutrophil %: 87.3 %
Platelet: 84 10*3/uL — ABNORMAL LOW (ref 150–440)
Platelet: 102 10*3/uL — ABNORMAL LOW (ref 150–440)
RBC: 3 10*6/uL — AB (ref 4.40–5.90)
RBC: 3.1 10*6/uL — AB (ref 4.40–5.90)
RDW: 17.9 % — ABNORMAL HIGH (ref 11.5–14.5)
RDW: 18.4 % — ABNORMAL HIGH (ref 11.5–14.5)
WBC: 6.6 10*3/uL (ref 3.8–10.6)
WBC: 9.3 10*3/uL (ref 3.8–10.6)

## 2014-04-20 LAB — RENAL FUNCTION PANEL
Albumin: 2.6 g/dL — ABNORMAL LOW (ref 3.4–5.0)
Anion Gap: 6 — ABNORMAL LOW (ref 7–16)
BUN: 42 mg/dL — AB (ref 7–18)
CHLORIDE: 105 mmol/L (ref 98–107)
Calcium, Total: 8.7 mg/dL (ref 8.5–10.1)
Co2: 30 mmol/L (ref 21–32)
Creatinine: 2.86 mg/dL — ABNORMAL HIGH (ref 0.60–1.30)
EGFR (African American): 23 — ABNORMAL LOW
EGFR (Non-African Amer.): 20 — ABNORMAL LOW
GLUCOSE: 129 mg/dL — AB (ref 65–99)
Osmolality: 293 (ref 275–301)
PHOSPHORUS: 2.7 mg/dL (ref 2.5–4.9)
Potassium: 4.1 mmol/L (ref 3.5–5.1)
Sodium: 141 mmol/L (ref 136–145)

## 2014-04-20 LAB — PLATELET COUNT: Platelet: 92 10*3/uL — ABNORMAL LOW (ref 150–440)

## 2014-04-20 LAB — PROTIME-INR
INR: 1.4
Prothrombin Time: 16.8 secs — ABNORMAL HIGH (ref 11.5–14.7)

## 2014-04-21 LAB — BASIC METABOLIC PANEL
ANION GAP: 7 (ref 7–16)
BUN: 29 mg/dL — ABNORMAL HIGH (ref 7–18)
CO2: 31 mmol/L (ref 21–32)
Calcium, Total: 8.4 mg/dL — ABNORMAL LOW (ref 8.5–10.1)
Chloride: 104 mmol/L (ref 98–107)
Creatinine: 2.24 mg/dL — ABNORMAL HIGH (ref 0.60–1.30)
EGFR (African American): 31 — ABNORMAL LOW
EGFR (Non-African Amer.): 27 — ABNORMAL LOW
Glucose: 121 mg/dL — ABNORMAL HIGH (ref 65–99)
Osmolality: 290 (ref 275–301)
Potassium: 3.9 mmol/L (ref 3.5–5.1)
SODIUM: 142 mmol/L (ref 136–145)

## 2014-04-21 LAB — CBC WITH DIFFERENTIAL/PLATELET
BASOS ABS: 0 10*3/uL (ref 0.0–0.1)
Basophil #: 0 10*3/uL (ref 0.0–0.1)
Basophil %: 0.4 %
Basophil %: 0.4 %
EOS ABS: 0 10*3/uL (ref 0.0–0.7)
EOS PCT: 1.5 %
Eosinophil #: 0.1 10*3/uL (ref 0.0–0.7)
Eosinophil %: 0.2 %
HCT: 24.6 % — AB (ref 40.0–52.0)
HCT: 24.3 % — ABNORMAL LOW (ref 40.0–52.0)
HGB: 7.7 g/dL — ABNORMAL LOW (ref 13.0–18.0)
HGB: 7.4 g/dL — ABNORMAL LOW (ref 13.0–18.0)
LYMPHS PCT: 5.9 %
Lymphocyte #: 0.7 10*3/uL — ABNORMAL LOW (ref 1.0–3.6)
Lymphocyte #: 0.5 10*3/uL — ABNORMAL LOW (ref 1.0–3.6)
Lymphocyte %: 7.7 %
MCH: 27.6 pg (ref 26.0–34.0)
MCH: 26.8 pg (ref 26.0–34.0)
MCHC: 31.4 g/dL — ABNORMAL LOW (ref 32.0–36.0)
MCHC: 30.5 g/dL — AB (ref 32.0–36.0)
MCV: 88 fL (ref 80–100)
MCV: 88 fL (ref 80–100)
MONO ABS: 0.6 x10 3/mm (ref 0.2–1.0)
MONOS PCT: 7.1 %
MONOS PCT: 8.4 %
Monocyte #: 0.8 x10 3/mm (ref 0.2–1.0)
NEUTROS ABS: 5.5 10*3/uL (ref 1.4–6.5)
NEUTROS PCT: 86.4 %
NEUTROS PCT: 82 %
Neutrophil #: 9.7 10*3/uL — ABNORMAL HIGH (ref 1.4–6.5)
PLATELETS: 40 10*3/uL — AB (ref 150–440)
Platelet: 63 10*3/uL — ABNORMAL LOW (ref 150–440)
RBC: 2.79 10*6/uL — ABNORMAL LOW (ref 4.40–5.90)
RBC: 2.77 10*6/uL — ABNORMAL LOW (ref 4.40–5.90)
RDW: 18.1 % — AB (ref 11.5–14.5)
RDW: 18.3 % — ABNORMAL HIGH (ref 11.5–14.5)
WBC: 11.3 10*3/uL — ABNORMAL HIGH (ref 3.8–10.6)
WBC: 6.7 10*3/uL (ref 3.8–10.6)

## 2014-04-21 LAB — PROTIME-INR
INR: 1.5
Prothrombin Time: 18.1 secs — ABNORMAL HIGH (ref 11.5–14.7)

## 2014-04-21 LAB — HEMOGLOBIN: HGB: 7.3 g/dL — ABNORMAL LOW (ref 13.0–18.0)

## 2014-04-22 LAB — CBC WITH DIFFERENTIAL/PLATELET
Basophil #: 0.1 10*3/uL (ref 0.0–0.1)
Basophil %: 0.7 %
Eosinophil #: 0.1 10*3/uL (ref 0.0–0.7)
Eosinophil %: 0.6 %
HCT: 23.9 % — ABNORMAL LOW (ref 40.0–52.0)
HGB: 7.7 g/dL — ABNORMAL LOW (ref 13.0–18.0)
Lymphocyte #: 0.6 10*3/uL — ABNORMAL LOW (ref 1.0–3.6)
Lymphocyte %: 5.8 %
MCH: 28.2 pg (ref 26.0–34.0)
MCHC: 32.1 g/dL (ref 32.0–36.0)
MCV: 88 fL (ref 80–100)
Monocyte #: 0.7 x10 3/mm (ref 0.2–1.0)
Monocyte %: 6.4 %
Neutrophil #: 9.1 10*3/uL — ABNORMAL HIGH (ref 1.4–6.5)
Neutrophil %: 86.5 %
PLATELETS: 66 10*3/uL — AB (ref 150–440)
RBC: 2.71 10*6/uL — ABNORMAL LOW (ref 4.40–5.90)
RDW: 18.4 % — AB (ref 11.5–14.5)
WBC: 10.5 10*3/uL (ref 3.8–10.6)

## 2014-04-23 LAB — CBC WITH DIFFERENTIAL/PLATELET
BASOS ABS: 0 10*3/uL (ref 0.0–0.1)
BASOS PCT: 0.6 %
EOS PCT: 1.6 %
Eosinophil #: 0.1 10*3/uL (ref 0.0–0.7)
HCT: 23.9 % — AB (ref 40.0–52.0)
HGB: 7.6 g/dL — ABNORMAL LOW (ref 13.0–18.0)
LYMPHS ABS: 0.9 10*3/uL — AB (ref 1.0–3.6)
LYMPHS PCT: 13.6 %
MCH: 28 pg (ref 26.0–34.0)
MCHC: 31.9 g/dL — ABNORMAL LOW (ref 32.0–36.0)
MCV: 88 fL (ref 80–100)
MONO ABS: 0.6 x10 3/mm (ref 0.2–1.0)
Monocyte %: 8.8 %
NEUTROS PCT: 75.4 %
Neutrophil #: 5 10*3/uL (ref 1.4–6.5)
PLATELETS: 64 10*3/uL — AB (ref 150–440)
RBC: 2.71 10*6/uL — ABNORMAL LOW (ref 4.40–5.90)
RDW: 18.6 % — AB (ref 11.5–14.5)
WBC: 6.6 10*3/uL (ref 3.8–10.6)

## 2014-04-23 LAB — BASIC METABOLIC PANEL
Anion Gap: 7 (ref 7–16)
BUN: 26 mg/dL — ABNORMAL HIGH (ref 7–18)
CALCIUM: 8.5 mg/dL (ref 8.5–10.1)
Chloride: 100 mmol/L (ref 98–107)
Co2: 30 mmol/L (ref 21–32)
Creatinine: 2.85 mg/dL — ABNORMAL HIGH (ref 0.60–1.30)
EGFR (African American): 23 — ABNORMAL LOW
GFR CALC NON AF AMER: 20 — AB
GLUCOSE: 151 mg/dL — AB (ref 65–99)
Osmolality: 281 (ref 275–301)
Potassium: 3.8 mmol/L (ref 3.5–5.1)
SODIUM: 137 mmol/L (ref 136–145)

## 2014-04-23 LAB — MAGNESIUM: Magnesium: 1.8 mg/dL

## 2014-04-24 LAB — CBC WITH DIFFERENTIAL/PLATELET
BASOS ABS: 0 10*3/uL (ref 0.0–0.1)
Basophil %: 0.3 %
Eosinophil #: 0 10*3/uL (ref 0.0–0.7)
Eosinophil %: 0.6 %
HCT: 24 % — ABNORMAL LOW (ref 40.0–52.0)
HGB: 7.8 g/dL — ABNORMAL LOW (ref 13.0–18.0)
LYMPHS ABS: 0.9 10*3/uL — AB (ref 1.0–3.6)
Lymphocyte %: 17.8 %
MCH: 28.1 pg (ref 26.0–34.0)
MCHC: 32.3 g/dL (ref 32.0–36.0)
MCV: 87 fL (ref 80–100)
Monocyte #: 0.6 x10 3/mm (ref 0.2–1.0)
Monocyte %: 11.8 %
Neutrophil #: 3.6 10*3/uL (ref 1.4–6.5)
Neutrophil %: 69.5 %
Platelet: 86 10*3/uL — ABNORMAL LOW (ref 150–440)
RBC: 2.76 10*6/uL — AB (ref 4.40–5.90)
RDW: 19.2 % — ABNORMAL HIGH (ref 11.5–14.5)
WBC: 5.1 10*3/uL (ref 3.8–10.6)

## 2014-04-24 LAB — PHOSPHORUS: PHOSPHORUS: 2.8 mg/dL (ref 2.5–4.9)

## 2014-04-25 LAB — CBC WITH DIFFERENTIAL/PLATELET
Basophil #: 0 10*3/uL (ref 0.0–0.1)
Basophil %: 0.3 %
EOS ABS: 0.1 10*3/uL (ref 0.0–0.7)
EOS PCT: 1.1 %
HCT: 24.8 % — AB (ref 40.0–52.0)
HGB: 7.9 g/dL — ABNORMAL LOW (ref 13.0–18.0)
LYMPHS ABS: 0.9 10*3/uL — AB (ref 1.0–3.6)
Lymphocyte %: 15.4 %
MCH: 27.8 pg (ref 26.0–34.0)
MCHC: 31.9 g/dL — AB (ref 32.0–36.0)
MCV: 87 fL (ref 80–100)
MONO ABS: 0.8 x10 3/mm (ref 0.2–1.0)
Monocyte %: 13 %
NEUTROS PCT: 70.2 %
Neutrophil #: 4.1 10*3/uL (ref 1.4–6.5)
PLATELETS: 74 10*3/uL — AB (ref 150–440)
RBC: 2.85 10*6/uL — ABNORMAL LOW (ref 4.40–5.90)
RDW: 18.8 % — AB (ref 11.5–14.5)
WBC: 5.8 10*3/uL (ref 3.8–10.6)

## 2014-04-26 LAB — CBC WITH DIFFERENTIAL/PLATELET
BASOS PCT: 0.3 %
Basophil #: 0 10*3/uL (ref 0.0–0.1)
EOS ABS: 0.1 10*3/uL (ref 0.0–0.7)
EOS PCT: 1.7 %
HCT: 23.7 % — ABNORMAL LOW (ref 40.0–52.0)
HGB: 7.7 g/dL — ABNORMAL LOW (ref 13.0–18.0)
Lymphocyte #: 1 10*3/uL (ref 1.0–3.6)
Lymphocyte %: 17.4 %
MCH: 27.7 pg (ref 26.0–34.0)
MCHC: 32.3 g/dL (ref 32.0–36.0)
MCV: 86 fL (ref 80–100)
Monocyte #: 0.8 x10 3/mm (ref 0.2–1.0)
Monocyte %: 13.6 %
NEUTROS PCT: 67 %
Neutrophil #: 4 10*3/uL (ref 1.4–6.5)
PLATELETS: 88 10*3/uL — AB (ref 150–440)
RBC: 2.76 10*6/uL — AB (ref 4.40–5.90)
RDW: 18.7 % — AB (ref 11.5–14.5)
WBC: 6 10*3/uL (ref 3.8–10.6)

## 2014-04-27 DIAGNOSIS — I4891 Unspecified atrial fibrillation: Secondary | ICD-10-CM

## 2014-04-27 LAB — BASIC METABOLIC PANEL
Anion Gap: 6 — ABNORMAL LOW (ref 7–16)
BUN: 31 mg/dL — ABNORMAL HIGH (ref 7–18)
CO2: 32 mmol/L (ref 21–32)
Calcium, Total: 7.9 mg/dL — ABNORMAL LOW (ref 8.5–10.1)
Chloride: 101 mmol/L (ref 98–107)
Creatinine: 4.38 mg/dL — ABNORMAL HIGH (ref 0.60–1.30)
EGFR (African American): 14 — ABNORMAL LOW
GFR CALC NON AF AMER: 12 — AB
Glucose: 108 mg/dL — ABNORMAL HIGH (ref 65–99)
Osmolality: 285 (ref 275–301)
POTASSIUM: 3.5 mmol/L (ref 3.5–5.1)
Sodium: 139 mmol/L (ref 136–145)

## 2014-04-27 LAB — HEMOGLOBIN: HGB: 7.8 g/dL — AB (ref 13.0–18.0)

## 2014-04-27 LAB — PHOSPHORUS: Phosphorus: 3.2 mg/dL (ref 2.5–4.9)

## 2014-04-29 ENCOUNTER — Ambulatory Visit: Payer: Self-pay | Admitting: Oncology

## 2014-05-02 ENCOUNTER — Other Ambulatory Visit: Payer: Self-pay | Admitting: Internal Medicine

## 2014-05-04 ENCOUNTER — Other Ambulatory Visit: Payer: Self-pay | Admitting: Internal Medicine

## 2014-05-04 LAB — CBC WITH DIFFERENTIAL/PLATELET
Basophil #: 0 10*3/uL (ref 0.0–0.1)
Basophil %: 0.4 %
Eosinophil #: 0 10*3/uL (ref 0.0–0.7)
Eosinophil %: 0.7 %
HCT: 27 % — ABNORMAL LOW (ref 40.0–52.0)
HGB: 8.8 g/dL — AB (ref 13.0–18.0)
LYMPHS ABS: 1.1 10*3/uL (ref 1.0–3.6)
LYMPHS PCT: 15.4 %
MCH: 27.6 pg (ref 26.0–34.0)
MCHC: 32.4 g/dL (ref 32.0–36.0)
MCV: 85 fL (ref 80–100)
Monocyte #: 0.8 x10 3/mm (ref 0.2–1.0)
Monocyte %: 10.7 %
NEUTROS PCT: 72.8 %
Neutrophil #: 5.4 10*3/uL (ref 1.4–6.5)
Platelet: 217 10*3/uL (ref 150–440)
RBC: 3.17 10*6/uL — ABNORMAL LOW (ref 4.40–5.90)
RDW: 19.3 % — ABNORMAL HIGH (ref 11.5–14.5)
WBC: 7.4 10*3/uL (ref 3.8–10.6)

## 2015-02-20 NOTE — Consult Note (Signed)
Brief Consult Note: Diagnosis: recurrent melena.   Patient was seen by consultant.   Consult note dictated.   Recommend further assessment or treatment.   Discussed with Attending MD.   Comments: Please see consult [phone]#[id number]. Paitent with recurrent melena, in the setting of anticoagulation and thrombocytopenia.  Case discussed with Dr Denzil MagnusonViackute.  Platelets given last night before the 0138 cbc was drawn, that showing plt of 43.  Continue serial cbc/pt/plt, transfuse as needed.  Agree with repeat when clinically feasible with plt above 65 and INR less than 1.5.   Only one small episode of black stool today, likely bleeding has either stopped or slowed markedly.  Hemodynamically stable. Following.  Electronic Signatures: Barnetta ChapelSkulskie, Martin (MD)  (Signed 20-Jun-15 15:33)  Authored: Brief Consult Note   Last Updated: 20-Jun-15 15:33 by Barnetta ChapelSkulskie, Martin (MD)

## 2015-02-20 NOTE — Consult Note (Signed)
PATIENT NAME:  Daniel Pennington, Daniel Pennington MR#:  [id number] DATE OF BIRTH:  [birth year]  DATE OF CONSULTATION:  02/23/2014  REFERRING PHYSICIAN:   CONSULTING PHYSICIAN:  Cheral Marker. Ola Spurr, MD  ADDENDUM  1. The patient does have a PENICILLIN ALLERGY. 2. We will check an ESR and a CRP today to monitor. 3. Patient will need 6 weeks of IV antibiotics, likely.  Could consider shorter course depending on his response and followup evaluation of his wound.  4. I will be glad to follow the patient as an outpatient as needed.   Thank you for the consult.   ____________________________ Cheral Marker. Ola Spurr, MD dpf:dd D: 02/23/2014 21:46:00 ET T: 02/23/2014 22:52:10 ET JOB#: [id number]  cc: Cheral Marker. Ola Spurr, MD, <Dictator> Genova Kiner Ola Spurr MD ELECTRONICALLY SIGNED 03/05/2014 14:12

## 2015-02-20 NOTE — Consult Note (Signed)
PATIENT NAME:  Daniel Pennington, Daniel Pennington MR#:  [id number] DATE OF BIRTH:  1932/09/11  DATE OF CONSULTATION:  04/13/2014  REQUESTING PHYSICIAN:  Dr. Winona LegatoVaickute CONSULTING PHYSICIAN:  Lorin PicketScott C. Lonna CobbStoioff, MD  REASON FOR CONSULTATION: Hematuria.   HISTORY OF PRESENT ILLNESS: A 81 year old African American male admitted 04/12/2014 with shortness of breath, abdominal distention, melena, and anemia. He was [facility] from late April to mid May and during that hospitalization had gross hematuria which did require placement of a 3-way Foley catheter. He was seen by Dr. Evelene CroonWolff at that visit. His catheter was removed and he has noted intermittent hematuria since that time. He has no dysuria, frequency or urgency.  A CT of the abdomen was performed in the Emergency Department which did show hyperdense material in the bladder most likely secondary to a clot. He has chronic kidney disease progressing to end-stage renal disease. He has a dialysis catheter in place and has been started on dialysis on this admission. He is on chronic anticoagulation and his Coumadin has been reversed during this hospitalization. He states he [facility] and apparently had a cystoscopy. He states they did not drain his bladder and he had significant discomfort afterwards and states he will not [facility] back [facility]. His past medical history lists a history of prostate cancer; however, he does not remember any treatment.   PAST MEDICAL HISTORY:  1. Chronic kidney disease.  2. History of osteomyelitis.  3. Peripheral vascular disease.  4. Valvular heart disease.  5. Hypertension.  6. Congestive heart failure.  7. Hematuria.  8. Atrial fibrillation.  9. Chronic anemia.   PAST SURGERY:  1. Angioplasty, left lower extremity.  2. Left 5th toe and metatarsal amputation.  3. Rotator cuff surgery.  4. Aortic valve replacement.   ALLERGIES: PENICILLIN.   MEDICATIONS ON ADMISSION: [facility].   SOCIAL  HISTORY: A history of alcohol use in the past, none in the present. Denies tobacco use.   FAMILY HISTORY: Remarkable for hypertension.   REVIEW OF SYSTEMS:  Otherwise noncontributory except as per the HPI.   LABORATORY DATA:  Creatinine on admission is 4.8. Hemoglobin is 5.4.   PHYSICAL EXAMINATION:  VITAL SIGNS: Temperature 97.9, BP 111/63, pulse 57.  GENERAL: Alert male in no acute distress.  ABDOMEN: Protuberant, soft.  GENITOURINARY:  Positive phimosis.   RADIOLOGICAL DATA: CT was reviewed. This study is without contrast. Significant ascites is present. Kidneys are atrophic without calcification, mass or hydronephrosis.  There is hyperdense material within the bladder. Prostate gland is present.   IMPRESSION:  1. Gross hematuria.  2. Phimosis.   RECOMMENDATION: He is currently off anticoagulation and his Coumadin has been reversed. If medically cleared for anesthesia can proceed with cystoscopy under anesthesia on June 17th. He may also need a dorsal slit secondary to his significant phimosis.     ____________________________ Verna CzechScott C. Lonna CobbStoioff, MD scs:dd D: 04/13/2014 17:49:09 ET T: 04/13/2014 18:50:46 ET JOB#: [id number]  cc: Lorin PicketScott C. Lonna CobbStoioff, MD, <Dictator> Riki AltesSCOTT C STOIOFF MD ELECTRONICALLY SIGNED 04/15/2014 8:50

## 2015-02-20 NOTE — Consult Note (Signed)
PATIENT NAME:  Daniel Pennington, Daniel Pennington MR#:  [id number] DATE OF BIRTH:  08-09-1932  DATE OF CONSULTATION:  04/18/2014  REFERRING PHYSICIAN:   Dr. Winona LegatoVaickute. CONSULTING PHYSICIAN:  Christena DeemMartin U. Skulskie, MD  REASON FOR CONSULTATION: Melena and abdominal distention.   HISTORY OF PRESENT ILLNESS: Daniel Pennington is a 81 year old African American male who was admitted to the hospital on 04/13/2014, after a recent hospitalization in regards to melena and abdominal distention, as well as osteomyelitis. Daniel Penning[facility] from [facility] through the mid May and was at rehabilitation. On his last admission, Daniel Pennington had a toe amputated due to osteomyelitis. Daniel Pennington also had an angioplasty done on the left tibial artery. Has multiple medical issues including mechanical aortic valve replacement, chronic kidney disease stage IV, hypertension, diastolic CHF, paroxysmal atrial fibrillation, anemia of chronic disease. When Daniel Pennington was in the hospital in April and May, Daniel Pennington did undergo an EGD on 03/02/2014. At that time, the EGD was done without taking him off his Coumadin and the INR on that date was at 2.9. That procedure was done for anemia and melena. However, there was no evidence of a bleeding lesion in the stomach. There was mention of a gastric polyp. This was not stated to have stigmata of bleeding and was it left in place due to him being on of anticoagulation at the time. Daniel Penning[facility] apparently from rehabilitation on June 14th, at that time with complaint of difficulty breathing, abdominal distention, and the melena recurrent. Daniel Pennington was, at that time, fully anticoagulated in regards to Coumadin for his mechanical heart valve. Daniel Pennington was seen in consultation by Dr. Bluford Kaufmannh on 06/15. His INR was 3.4 on admission to the hospital. Recommendation, at that time, was to stop the Coumadin to assist with the bleeding stop and an EGD was recommended if the INR was 1.5 or less. There did not seem to be any acute problem at the time. Daniel Pennington  also presented, at that time, with anasarca and was to undergo dialysis. Since that time, Daniel Pennington had been relatively stable until yesterday when there were 2 medium stools that were black. This morning, I was called by Dr. Winona LegatoVaickute in regards to reconsultation. Daniel Pennington has had one small black stool today. Daniel Pennington has been hemodynamically stable. She has given him several units of fresh frozen plasma, as well as some vitamin K to assist in reversal of the Coumadin. It is of note, however, that heparin, apparently given to him in regards to his dialysis catheter, has possibly caused some problems with thrombocytopenia and indeed, platelets noted to be 141 on admission, had dropped precipitously to 27. That was as of yesterday. His INR yesterday was 2.3. INR currently pending and his platelet count this morning was 43. His hemoglobin this morning was 7.4, yesterday evening being 6.5, and 7.4 yesterday morning. Daniel Pennington had undergone 1 unit of red blood cells transfusion yesterday. There was a bleeding scan done yesterday that was called as positive in the region projecting over the stomach. Again, Daniel Pennington currently denies any abdominal pain. Daniel Pennington continues to have complaint of abdominal bloating. There has been some nausea about 2 days ago, at which time Daniel Pennington had some emesis; however, that has not recurred since that time. Daniel Pennington states Daniel Pennington has been seeing some black stools intermittently at home for about 2 months. On the date of his EGD, 03/02/2014, a colonoscopy was also attempted; however, the prep was poor and was discontinued in the rectum.   PAST MEDICAL HISTORY:  1.  Acute on chronic renal insufficiency with hemodialysis beginning on his last admission.  2.  CKD stage IV at baseline.  3.  Left tibial artery angioplasty.  4.  Left 5th metatarsal osteomyelitis, status post amputation.  5.  History of aortic valve replacement with a mechanical valve, that being done, I believe, in 2004, on current chronic Coumadin anticoagulation.  6.   Hypertension.  7.  Diastolic CHF.  8.  Urinary retention.  9.  Hematuria.  10.  Paroxysmal atrial fibrillation.  11.  History of abdominal distention.  12.  Anemia of chronic disease.  13.  Hematuria. The patient states that Daniel Pennington did undergo what sounds like a cystoscopy at the [place] in [place] and experienced some bleeding at that time through the urinary tract. I am uncertain as to whether there were any biopsies, etc. done. The patient states that there was fluid left in place and evidently Daniel Pennington had problem with urinary retention.  14.  History of constipation.  15.  History of prostate cancer.  16.  Left shoulder rotator cuff surgery. The patient also with a history of alcohol abuse, although Daniel Pennington states Daniel Pennington stopped drinking alcohol around 1990.   PHYSICAL EXAMINATION:  VITAL SIGNS: Temperature is 98.3, pulse 83, respirations between 14 and 20, blood pressure 96/47, pulse oximetry 93%.  GENERAL: Daniel Pennington is an elderly appearing 81 year old African American male in no acute distress. Daniel Pennington is hard of hearing. Daniel Pennington does read lips. Daniel Pennington denies any acute distress or abdominal pain.  HEENT: Normocephalic, atraumatic. Eyes: Anicteric.  Nose: Septum midline.  Oropharynx: Poor dentition.  NECK: No JVD.  HEART: Regular rate and rhythm, mechanical valve noted.  LUNGS:  Minimal basilar crackles, negative otherwise. ABDOMEN: Moderate distention. Bowel sounds are positive, although there is a mild tympany and there is some apparent ascites. Daniel Pennington is nontender.  EXTREMITIES: Two to 3+ lower extremity edema bilaterally.  LABORATORY, DIAGNOSTIC, AND RADIOLOGICAL DATA: Laboratories include the following:  Glucose 126, BUN 54, creatinine 2.80, sodium 142, potassium 3.8, chloride 106, bicarbonate 31, osmolality 299, calcium 8.2, phosphorus 2.1, magnesium 2.0. Daniel Pennington had a hemogram this morning showing a white count of 7.6, hemoglobin and hematocrit 7.5/23.3, platelet count of 43,000. A repeat hemoglobin at 11:09 was 7.4. Daniel Pennington has been issued  2 units of fresh frozen plasma. Pro time is still pending. His last set of liver-associated enzymes was done on admission 04/12/2014, showing a total protein of 7.2, albumin 2.7, total bilirubin 0.4, alkaline phosphatase 89, AST 21, ALT 11. His INR on admission was 3.4. His BUN and creatinine on admission were 117 and 4.8 respectively. Daniel Pennington had a CT scan of the abdomen and pelvis without contrast for abdominal distention, this showing a large amount of abdominal ascites and diffuse subcutaneous edema compatible with anasarca, small to moderate right pleural effusion with atelectasis. A 2.2 x 4.2 cm hyperdense focus within the bladder. This felt to be most likely representing a focal hemorrhage. A mass lesion is considered less likely. Bilateral inguinal lymph nodes and ankylosis in the thoracolumbar spine with straightening of the normal lumbar lordosis. Bleeding scan as noted above that was done yesterday. Daniel Pennington had an abdominal 2-way film on the 16th, this showing centralized bowel loops compatible with ascites, borderline dilated small bowel loops centrally, interstitial accentuation of the lung bases with potential small right plural effusion.   ASSESSMENT:  1.  Recurrent melena. Black stools would infer either a very slow transit or a very higher in the gastrointestinal tract bleed. Recent EGD  done on 03/02/2014, showed no evidence of upper gastrointestinal bleed, although there was a single polyp noted. There was no remark as to a  bleeding source and it is of note that the single pedunculated polyp showed no stigmata of recent bleeding. The patient had two moderate-sized bowel movements yesterday that were melanotic, consistent with possible upper gastrointestinal bleeding. Daniel Pennington did have a bleeding scan yesterday that was possibly positive for gastric source. However, there has been only one episode of a very small amount of stool passed since yesterday this also being black. It is quite possible that the  bleeding lesion has stopped in the face of reversal of the Coumadin. It is of note that recommendation on EGD previously was repeated off the anticoagulation for possible removal of the polyp. Daniel Pennington is currently hemodynamically stable. Daniel Pennington denies any abdominal pain.   2.  History of alcohol use, remote. There was no mention of esophageal or gastric varices or portal hypertensive gastropathy on the aforementioned EGD and review of pictures from that procedure shows no evidence apparently of varices.   Serial hemoglobin, transfuse as needed. Currently still pending is his INR from today. It is of note that his platelet count had declined remarkably as above. Daniel Pennington has shown no fresh bleeding today and has no abdominal pain. Daniel Pennington has had no hematemesis throughout this process.   RECOMMENDATION:  1.   Serial hemoglobins, transfuse as needed. It will be necessary to hold anticoagulation. Agree with current plans as outlined by Dr. Winona Legato.  2.  EGD when clinically feasible.  3.  Again INR will need to be 1.5 or less with a platelet count in excess of 65,000, before repeat scope.   We will follow with you.    ____________________________ Christena Deem, MD mus:ts D: 04/18/2014 15:24:02 ET T: 04/18/2014 16:01:30 ET JOB#: [id number]  cc: Christena Deem, MD, <Dictator> Christena Deem MD ELECTRONICALLY SIGNED 04/23/2014 9:26

## 2015-02-20 NOTE — Consult Note (Signed)
Present Illness The [facility] with a diabetic left lateral foot ulcer, status post fifth ray amputation done by Dr. Ether GriffinsFowler several weeks ago.  Apparently, he was in a rehab facility and left AMA with the intent to have antibiotics given at home.  He never received his last 9 to 10 doses of the daptomycin. He says his foot has been a little sore and tender, still has drainage and opening on the plantar aspect of the left foot. He also has a history of end stage kidney disease. He also has a venous stasis-type wound on his left lower leg.   He has had some significant anemia. He has had 2 transfusions to bring up to his hemoglobin, which is more stable now.  PAST MEDICAL HISTORY: 1.  Acute on chronic renal insufficiency progressing to hemodialysis last admission.  2.  CKD stage IV at baseline.  3.  Left fifth metatarsal osteomyelitis, status post amputation.  4.  Left tibial artery angioplasty.  5.  History of mechanical aortic valve replacement.  6.  Hypertension.  7.  Diastolic CHF.   8.  Urinary retention.  9.  Hematuria.  10.  Paroxysmal atrial fibrillation.  11.  Abdominal distention.  12.  Anemia of chronic disease.  13.  Hematuria.  14.  Constipation.   PAST SURGICAL HISTORY: 1.  Angioplasty to the left leg.  2.  Left fifth toe metatarsal amputation.  3.  History of prostate cancer.  4.  Left shoulder rotator cuff surgery.  5.  Aortic valve replacement surgery.   Home Medications: Medication Instructions Status  warfarin 7.5 mg oral tablet 1 tab(s) orally once a day on Monday, Wednesday, Thursday, Saturday, and Sunday. Active  iron polysaccharide (as elemental iron) 150 mg oral capsule 1 cap(s) orally 3 times a day Active  DAPTOmycin 600 milligram(s) intravenous every 48 hours with dialysis Active  ranitidine 150 mg oral tablet 1 tab(s) orally once a day Active  Lubrifresh P.M. - ophthalmic ointment 1 application to each affected eye once a day (at bedtime) Active   furosemide 40 mg oral tablet 1 tab(s) orally 2 times a day Active  sodium bicarbonate 325 mg oral tablet 2 tabs (650mg ) orally 2 times a day Active  warfarin 10 mg oral tablet 1 tab(s) orally once a day on Tuesday and Friday. Active    Penicillin: Unknown  Case History:  Family History Non-Contributory   Social History negative tobacco, negative ETOH, negative Illicit drugs   Review of Systems:  ROS Pt not able to provide ROS  lethargic and confused   Physical Exam:  GEN well developed, no acute distress, disheveled   HEENT hearing intact to voice, dry oral mucosa, poor dentition   NECK supple  trachea midline   RESP normal resp effort  no use of accessory muscles   CARD LE edema present  no JVD   VASCULAR ACCESS Dialysis catheter present  -- Purulent drainage   ABD denies tenderness  distended   EXTR positive edema, pedal and popliteal pulses nonpalpable bilaterally.  left lateral foot ulcers and two ulcers of the shin  appear superficial no purulence or crepitence   SKIN positive ulcers, skin turgor good   PSYCH poor insight, lethargic   Nursing/Ancillary Notes: **Vital Signs.:   16-Jun-15 11:09  Vital Signs Type Routine  Temperature Temperature (F) 97.6  Celsius 36.4  Temperature Source oral  Pulse Pulse 58  Respirations Respirations 20  Systolic BP Systolic BP 121  Diastolic BP (mmHg)  Diastolic BP (mmHg) 79  Mean BP 93  Pulse Ox % Pulse Ox % 97  Pulse Ox Activity Level  At rest  Oxygen Delivery Room Air/ 21 %    Impression 1.  Atherosclerotic disease with left foot ulcerations.  Given the lack of palpable puses I believe he should have angiography with the intention to treat for limb salvage.  IV antibiotics should continue and local wound care.  Dialysis must continue to alleviate the massive edema of the legs. 2.  Acute on chronic anemia. The patient has history of anemia of chronic disease, hemoglobin at baseline during last admission, seems to be  around 8. He is down to 5.4 now, with melena, likely GI bleed. He also had EGD, colonoscopy done last admission, and it showing only polyps. However, patient also on Coumadin for his mechanical valve, and INR is at 3.4. Two units of packed RBCs  are being transferred, and GI has been consulted. Placed on Protonix drip.  Continue to adjust the Coumadin to the hea 3.  Left foot osteomyelitis.  The patient says he was never given any antibiotics.  Restart antibiotics ID on consult. 4.  End stage renal disease. Initiated on hemodialysis last admission. Never followed up for dialysis after discharge.  Surprisingly,  potassium within normal limits. Nephrology has been consulted, and significant dependent edema is noted, with minimal urine output  5. Mechanical aortic valve. Hold Coumadin due to GI bleed and anemia. Will need anticoagulation with his mechanical valve. Will consult Cardiology, especially aortic valve being the high flow valve. Hopefully  risk of thrombus is relatively low. INR today is 4.2  6.  Urinary retention and clots last admission. Was seen by Urology, now stable.   Plan Level 4 consult   Electronic Signatures: Levora Dredge (MD)  (Signed 16-Jun-15 17:43)  Authored: General Aspect/Present Illness, Home Medications, Allergies, History and Physical Exam, Vital Signs, Impression/Plan   Last Updated: 16-Jun-15 17:43 by Levora Dredge (MD)

## 2015-02-20 NOTE — Op Note (Signed)
PATIENT NAME:  Daniel Pennington, Daniel Pennington MR#:  [id number] DATE OF BIRTH:  [birth year]  DATE OF PROCEDURE:  03/03/2014  PREOPERATIVE DIAGNOSES:  1. Urinary retention.  2. Hematuria.   POSTOPERATIVE DIAGNOSES:  1. Urinary retention.  2. Hematuria.   PROCEDURES:  1. Placement of 3-way Foley catheter  2. Bladder irrigation.   SURGEON: Suszanne ConnersMichael R. Evelene CroonWolff, M.D.   INDICATIONS: See the consultation.   OPERATIVE SUMMARY: The previously placed 16-French Foley catheter was removed. The perineum was prepped and draped in the usual fashion. A 26-French 3-way Foley catheter was placed and residual of 50 cc. noted.  The catheter was attached to 3-way irrigation with saline. Manual irrigation with a Toomey syringe resulted in a few small clots' After initiation of CBI, there was completely clear drainage of CBI fluid from the out port. The patient tolerated the procedure well.   ____________________________ Suszanne ConnersMichael R. Evelene CroonWolff, MD mrw:gb D: 03/03/2014 18:47:08 ET T: 03/04/2014 03:13:01 ET JOB#: [id number]  cc: Suszanne ConnersMichael R. Evelene CroonWolff, MD, <Dictator> Orson ApeMICHAEL R Atif Chapple MD ELECTRONICALLY SIGNED 03/04/2014 8:23

## 2015-02-20 NOTE — Consult Note (Signed)
General Aspect 81 y/o male with a h/o CAD s/p CABG (2004 [facility]) along with AVR (mech vs bioprosth) on chronic coumadin.  He also has a h/o of:  1.  CKD with diatek cath placement 02/2014 with initiation of dialysis.  2.  Left fifth metatarsal osteomyelitis, status post amputation 02/2014 3.  Left tibial artery stenosis s/p angioplasty - 02/2014 4.  Hypertension.  5.  Chronic diastolic CHF.   6.  Urinary retention.  7.  Hematuria (ongoing) 8.  Abdominal distention/bloating/gas (improved with initiation of dialysis)  9.  Anemia of chronic disease.  10. Constipation.  11.  Remote tob/etoh abuse 12. History of prostate cancer.  13. Left shoulder rotator cuff surgery.  *********************   Present Illness He was admitted to [facility] in late April with left lower extremity cellulitis/osteomyelitis and ulceration.  He was placed on abx and eval by vascular surgery.  He underwent L fifth toe amputation and subsequently underwent PTA of the L tibial artery.  During admission, he developed acute on chronic renal failure requiring dialysis cath placement and initiation of HD.  He was also found to be in rate controlled afib, which was conservatively managed.  Finally, he was anemic with guaiac + stool with colon (incomplete prep) and EGD (small gastric polyp - not resected 2/2 coumadin on board).  Pt refused repeat studies.  He was eventually d/c'd to [facility] where he says his abx were not provided to him and wound care was sparse.  He did cont on dialysis but says that by the he was d/c'd from rehab to home, he was told that he may not require future dialysis.  Upon return home, he says that abx were shipped to his home but [facility] did not provide them to him.  Wound care was not carried out.  He did not have renal or INR f/u.  In that setting, he noted progressively increasing abd girth with bloating and gas, along with persistent hematuria and melena.  Due to progression of Ss, he presented to the ED  yesterday.  Here, he was found to be markedly anemic with an H/H of 5.4/16.9, creat of 4.8.  CT of the abd showed ascites and bladder hemorrhage.  INR 3.4.   Given his cardiac hx, we have been asked to eval.  Wt is up from a low of 218 during his last admission to 240 lbs today.   Physical Exam:  GEN pleasant, nad.   HEENT pink conjunctivae, moist oral mucosa, poor dentition   NECK supple  JVP to jaw.   RESP normal resp effort  diminished breaths bilat, more so @ bases, left basilar crackles.   CARD Irregular rate and rhythm  2/6 SEM RUSB.  Faint, mech S2.   ABD distended  hypoactive BS  protuberant with anasarca extending around lower back.   LYMPH negative neck   EXTR positive edema, 2+ bilat LEE with ulcerations to LLE (dressed) and gauze wrap to left foot.   SKIN normal to palpation, lower extremity skin changes r/t PAD/venous stasis.   NEURO grossly intact, nonfocal.  HOH.   PSYCH alert, A+O to time, place, person, good insight   Review of Systems:  General: No Complaints   Skin: Pain to LLE/foot r/t ulcerations and healing from amputation.   ENT: No Complaints   Eyes: No Complaints   Neck: No Complaints   Respiratory: Short of breath   Cardiovascular: Edema  LEE and abd bloating.   Gastrointestinal: Black tarry stools  abd  bloating and gas.  Frequent belching.   Genitourinary: Hematuria   Vascular: No Complaints   Musculoskeletal: No Complaints   Neurologic: No Complaints   Hematologic: No Complaints   Endocrine: No Complaints   Psychiatric: No Complaints   Review of Systems: All other systems were reviewed and found to be negative   Medications/Allergies Reviewed Medications/Allergies reviewed   Family & Social History:  Family and Social History:  Family History HTN/Renal Dzs   Social History negative Illicit drugs, prev smoked cigars and pipes -  quit in the 80's.  Prev drank heavily - Old Grand-dad  [employer]bon - "If you woked in the [facility] post  office, you were always drunk."   Place of Living Home  Lives by himself in [place].  Limited resources.  Owns car but can't drive it r/t to health issues.  Pays someone $50/day to help him with needs when he can afford it.     Afib: a. Noted during hospitalization 02/2014->rate controlled.   PAD: a. 01/2014 osteomyelitis of L fifth metatarsal s/p amputation;  b. 02/2014 s/p PTA of the L tibial artery.   Melena: a. 02/2014 Colonoscopy - incomplete prep - pt refused repeat study;  b. 02/2014 EGD: HH. Gastric Polyp (not resected 2/2 pt on coumadin).   Chronic Diastolic CHF: a. 12/4095 Echo: EF 50-55%, ? inf HK, mod conc LVH, diast dysfxn, mod bi-atrial enlargement, Mod AS, Mod TR, Mod elev PASP.   CKD V: a. 02/2014 Acute on chronic renal failure req initiation of HD.   Aortic Valve Disease: a. S/P mechanical AVR @ [facility] 2004 (at time of CABG);  b. Chronic coumadin.   CAD: a. s/p CABG in 2004 @ [facility].   Diabetes Mellitus, Type II (NIDD):    Prostate Cancer:    GERD:    HTN:    Rotator Cuff Surgery: left arm  Lab Results:  Hepatic:  14-Jun-15 12:37   Bilirubin, Total 0.4  Alkaline Phosphatase 89 (45-117 NOTE: New Reference Range 09/19/13)  SGPT (ALT)  11  SGOT (AST) 21  Total Protein, Serum 7.2  Albumin, Serum  2.7  Routine BB:  14-Jun-15 13:27   Crossmatch Unit 1 Ready  Crossmatch Unit 2 Ready  ABO Group + Rh Type O Positive  Antibody Screen NEGATIVE (Result(s) reported on 12 Apr 2014 at 02:21PM.)  Crossmatch Unit 3 Transfused  Crossmatch Unit 4 Transfused  Result(s) reported on 13 Apr 2014 at 05:44AM.    15:21   Fresh Frozen Plasma Unit 1 Transfused  Fresh Frozen Plasma Unit 2 Transfused  Result(s) reported on 13 Apr 2014 at 05:44AM.  Routine Chem:  14-Jun-15 12:37   Result Comment TROPONIN - RESULTS VERIFIED BY REPEAT TESTING.  - C/SHANNON HODNETT AT 1335 04/12/14-DAS  - READ-BACK PROCESS PERFORMED.  Result(s) reported on 12 Apr 2014 at 01:39PM.  Glucose,  Serum  143  BUN  117  Creatinine (comp)  4.80  Sodium, Serum 142  Potassium, Serum 4.9  Chloride, Serum  116  CO2, Serum  18  Calcium (Total), Serum  8.0  Anion Gap 8  Osmolality (calc) 323  eGFR (African American)  12  eGFR (Non-African American)  11 (eGFR values <15mL/min/1.73 m2 may be an indication of chronic kidney disease (CKD). Calculated eGFR is useful in patients with stable renal function. The eGFR calculation will not be reliable in acutely ill patients when serum creatinine is changing rapidly. It is not useful in  patients on dialysis. The eGFR calculation may not be applicable  to patients at the low and high extremes of body sizes, pregnant women, and vegetarians.)  B-Type Natriuretic Peptide [facility])  8925 (Result(s) reported on 12 Apr 2014 at 01:28PM.)  15-Jun-15 03:53   Result Comment LABS - This specimen was collected through an   - indwelling catheter or arterial line.  - A minimum of of blood was wasted prior    - to collecting the sample.  Interpret  - results with caution.  Result(s) reported on 13 Apr 2014 at 04:24AM.  Glucose, Serum  167  BUN  114  Creatinine (comp)  4.67  Sodium, Serum 141  Potassium, Serum 4.9  Chloride, Serum  115  CO2, Serum  19  Calcium (Total), Serum  8.0  Anion Gap 7  Osmolality (calc) 321  eGFR (African American)  13  eGFR (Non-African American)  11 (eGFR values <83mL/min/1.73 m2 may be an indication of chronic kidney disease (CKD). Calculated eGFR is useful in patients with stable renal function. The eGFR calculation will not be reliable in acutely ill patients when serum creatinine is changing rapidly. It is not useful in  patients on dialysis. The eGFR calculation may not be applicable to patients at the low and high extremes of body sizes, pregnant women, and vegetarians.)  Magnesium, Serum 2.4 (1.8-2.4 THERAPEUTIC RANGE: 4-7 mg/dL TOXIC: > 10 mg/dL  -----------------------)  Cardiac:  14-Jun-15 12:37    Troponin I  0.07 (0.00-0.05 0.05 ng/mL or less: NEGATIVE  Repeat testing in 3-6 hrs  if clinically indicated. >0.05 ng/mL: POTENTIAL  MYOCARDIAL INJURY. Repeat  testing in 3-6 hrs if  clinically indicated. NOTE: An increase or decrease  of 30% or more on serial  testing suggests a  clinically important change)  Routine UA:  14-Jun-15 20:27   Color (UA) Red  Clarity (UA) Cloudy  Glucose (UA) 50 mg/dL  Bilirubin (UA) Negative  Ketones (UA) Negative  Specific Gravity (UA) 1.014  Blood (UA) 2+  pH (UA) 6.0  Protein (UA) 100 mg/dL  Nitrite (UA) Negative  Leukocyte Esterase (UA) Negative (Result(s) reported on 12 Apr 2014 at 09:26PM.)  RBC (UA) 16496 /HPF  WBC (UA) 7 /HPF  Bacteria (UA) NONE SEEN  Epithelial Cells (UA) NONE SEEN  Result(s) reported on 12 Apr 2014 at 09:26PM.  Routine Coag:  14-Jun-15 12:37   Prothrombin  33.1  INR 3.4 (INR reference interval applies to patients on anticoagulant therapy. A single INR therapeutic range for coumarins is not optimal for all indications; however, the suggested range for most indications is 2.0 - 3.0. Exceptions to the INR Reference Range may include: Prosthetic heart valves, acute myocardial infarction, prevention of myocardial infarction, and combinations of aspirin and anticoagulant. The need for a higher or lower target INR must be assessed individually. Reference: The Pharmacology and Management of the Vitamin K  antagonists: the seventh ACCP Conference on Antithrombotic and Thrombolytic Therapy. Chest.2004 Sept:126 (3suppl): L7870634. A HCT value >55% may artifactually increase the PT.  In one study,  the increase was an average of 25%. Reference:  "Effect on Routine and Special Coagulation Testing Values of Citrate Anticoagulant Adjustment in Patients with High HCT Values." American Journal of Clinical Pathology 2006;126:400-405.)  Routine Hem:  14-Jun-15 12:37   WBC (CBC) 4.9  RBC (CBC)  2.03  Hemoglobin (CBC)  5.4   Hematocrit (CBC)  16.9  Platelet Count (CBC)  141 (Result(s) reported on 12 Apr 2014 at 12:49PM.)  MCV 83  MCH 26.4  MCHC  31.7  RDW  18.6  13:28   WBC (CBC) 5.1  RBC (CBC)  2.04  Hemoglobin (CBC)  5.4  Hematocrit (CBC)  17.2  Platelet Count (CBC)  144 (Result(s) reported on 12 Apr 2014 at 01:39PM.)  MCV 84  MCH 26.5  MCHC  31.5  RDW  18.6  15-Jun-15 03:53   WBC (CBC) 4.4  RBC (CBC)  2.46  Hemoglobin (CBC)  6.6  Hematocrit (CBC)  20.9  Platelet Count (CBC)  135  MCV 85  MCH 26.8  MCHC  31.6  RDW  17.9  Neutrophil % 73.2  Lymphocyte % 14.6  Monocyte % 10.8  Eosinophil % 0.8  Basophil % 0.6  Neutrophil # 3.2  Lymphocyte #  0.6  Monocyte # 0.5  Eosinophil # 0.0  Basophil # 0.0   EKG:  EKG Interp. by me   Interpretation sb, 1st deg avb, rbbb, lad, no scute st/t changes.   Radiology Results: XRay:    14-Jun-15 13:40, Chest Portable Single View  Chest Portable Single View   REASON FOR EXAM:    dyspnea  COMMENTS:       PROCEDURE: DXR - DXR PORTABLE CHEST SINGLE VIEW  - Apr 12 2014  1:40PM     CLINICAL DATA:  Shortness of breath.    EXAM:  PORTABLE CHEST - 1 VIEW    COMPARISON:  03/04/2014    FINDINGS:  There is a right sided dialysis catheter with tips in the cavoatrial  junction and right atrium. Right arm PICC line tip is in the distal  right subclavian vein. Heart size is enlarged. The patient is status  post median sternotomy and CABG procedure. Right upper lobe,right  lower lobe and left midlung atelectasis. Pulmonary vascular  congestion is noted.     IMPRESSION:  1. Bilateral platelike areas of atelectasis noted.  2. Cardiac enlargement and pulmonary vascular congestion.      Electronically Signed    By: Kerby Moors M.D.    On: 04/12/2014 13:49       Verified By: Angelita Ingles, M.D.,  CT:    14-Jun-15 17:02, CT Abdomen and Pelvis Without Contrast  CT Abdomen and Pelvis Without Contrast   REASON FOR EXAM:    (1) abdominal  distention; (2) abdonimal distention  COMMENTS:       PROCEDURE: CT  - CT ABDOMEN AND PELVIS W0  - Apr 12 2014  5:02PM     CLINICAL DATA:  Abdominal distention. Shortness of breath. Chronic  renal insufficiency. The patient has not had dialysis and 6 days.    EXAM:  CT ABDOMEN AND PELVIS WITHOUT CONTRAST    TECHNIQUE:  Multidetector CT imaging of the abdomen and pelvis was performed  following the standard protocol without IV contrast.  COMPARISON:  Abdominal ultrasound for ascites 02/24/2014    FINDINGS:  Heart is mildly enlarged. The vascular chambers are hypodense  suggesting anemia. Coronary artery calcifications are evident. A a  small right pleural effusion is present. Bibasilar atelectasis is  evident.    A large amount of abdominal ascites is present. The liver is within  normal limits. Granulomatous changes are present within the spleen.  The stomach, duodenum, and pancreas are unremarkable. The common  bile duct and gallbladder are normal. The adrenal glands are normal  bilaterally. The kidneys are somewhat atrophic no focal lesion or  obstruction is present.  No hemorrhage or mass is present at the bladder base. Urinary  bladder is otherwise unremarkable.    Atherosclerotic calcifications are present throughout  the abdominal  aorta without aneurysm. Diffuse edematous changes of the  subcutaneous tissues is compatible with anasarca. Enlarged inguinal  lymph nodes are present bilaterally, likely related to the edema. No  significant intraabdominal adenopathy is present.    The bone windows demonstrate ankylosis through the thoracolumbar  spine. There is straightening of the normal lumbar lordosis.     IMPRESSION:  1. A large amount of abdominal ascites and diffuse subcutaneous  edemacompatible with anasarca.  2. Small to moderate right pleural effusion at associated  atelectasis.  3. 2.2 x 4.2 cm hyperdense focus within the bladder. This most  likely represents  focal hemorrhage. A mass lesion is considered less  likely.  4. Bilateral inguinal lymph nodes are likely related to the lower  extremity edema.  5. Ankylosis through the thoracolumbar spine with straightening of  the normal lumbar lordosis.      Electronically Signed    By: Lawrence Santiago M.D.    On: 04/12/2014 17:16    Verified By: Resa Miner. MATTERN, M.D.,    Penicillin: Unknown  Vital Signs/Nurse's Notes: **Vital Signs.:   15-Jun-15 00:02  Vital Signs Type 15 min Post Blood Start Time  Temperature Temperature (F) 96.6  Celsius 35.8  Temperature Source rectal  Pulse Pulse 60  Respirations Respirations 22  Systolic BP Systolic BP 416  Diastolic BP (mmHg) Diastolic BP (mmHg) 65  Mean BP 82  Pulse Ox % Pulse Ox % 96  Oxygen Delivery Room Air/ 21 %    01:35  Vital Signs Type Blood Transfusion Complete  Temperature Temperature (F) 96.6  Celsius 35.8  Temperature Source rectal  Pulse Pulse 55  Respirations Respirations 22  Systolic BP Systolic BP 606  Diastolic BP (mmHg) Diastolic BP (mmHg) 65  Mean BP 84  Pulse Ox % Pulse Ox % 98  Oxygen Delivery Room Air/ 21 %    04:03  Vital Signs Type Routine  Temperature Temperature (F) 97.5  Celsius 36.3  Temperature Source oral  Pulse Pulse 52  Respirations Respirations 22  Systolic BP Systolic BP 301  Diastolic BP (mmHg) Diastolic BP (mmHg) 60  Mean BP 73  Pulse Ox % Pulse Ox % 98  Pulse Ox Activity Level  At rest  Oxygen Delivery Room Air/ 21 %  *Intake and Output.:   15-Jun-15 03:52  Grand Totals Intake:  753 Output:      Net:  601 09 Hr.:  666  IV (Primary)      In:  211  IV (Secondary)      In:  542    03:52  Weight Type daily  Weight Method Bed  Current Weight (lbs) (lbs) 240.1  Current Weight (kg) (kg) 108.9  Height (ft) (feet) 5  Height (in) (in) 10  Height (cm) centimeters 177.8  BSA (m2) 2.2  BMI (kg/m2) 34.4  Stool  smear, soft, brownish/reddish    05:50  Grand Totals Intake:   Output:   375    Net:  -375 24 Hr.:  291  Urine ml     Out:  375    05:50  Urinary Method  Urinal    Shift 07:00  Grand Totals Intake:  753 Output:  375    Net:  378 24 Hr.:  291  IV (Primary)      In:  211  IV (Secondary)      In:  542  Urine ml     Out:  375  Length of Stay Totals Intake:  1066 Output:  775    Net:  291    Daily 07:00  Grand Totals Intake:  1066 Output:  775    Net:  291 24 Hr.:  291  Blood Product      In:  313  IV (Primary)      In:  211  IV (Secondary)      In:  542  Urine ml     Out:  775  Length of Stay Totals Intake:  1066 Output:  775    Net:  291    Impression 1.  Acute GIB/Melena:  Pt with a h/o anemia of chronic dzs and FOB + stool with non-bleeding gastric polyp on EGD in May.  He is on chronic coumadin in the setting of AVR with INR of 3.4 on admission.  Pt reports "metallic" AVR while last echo suggested bioprosthetic AVR. ? if coumadin used for PAF previously. Dr. Fletcher Anon to review echo.  If it is truly bioprosthetic, then coumadin could more safely be discontinued.  He has been having melena since his d/c in May.  H/H down to 5.4/16.9 on admission.  GI to see.  On PPI.  Coumadin on hold for now.  2.  S/P Mech AVR:  As above, pt is on chronic coumadin.  INR 3.4 on admission.  He had not had his INR checked since he left rehab.  In setting of acute GIB and anemia, coumadin is on hold and he has received FFP.  See discussion above re: ? bioprosth vs mech AVR.  Pt says "metallic" and says he has a manuafacturer's card @ home with the type of valve that he has.  Will have to contact [facility] in [place] for this information however as it is crucial in determining whether or not we can safely discontinue anticoagulation.    3.  PAF:  Currently appears to be in sinus.  ECG with what appear to be very small P waves and a wide 1st deg avb.  Review of tele shows the same, though p morphology and PR intervals appear to be variable @ times.  He brady's down into the 40's in  the early morning hours - possibly during periods of sleep.  Will ask for a 12 lead rhythm strip.  He is not on any AVN blocking agents.  As above, coumadin on hold.  4. CAD:  No chest pain.  Trop mildly elevated @ 0.07 in setting of profound anemia.  Conservative Rx for now.  Nl EF by echo in April.  No bb in setting of bradycardia.  No ASA in setting of GIB.  5.  ESRD:  Nephrology has seen and plans to re-institute dialysis.   Plan 6.  Acute volume overload/Anasarca:  In setting of #5 and chronic diastolic chf (though hr/bp stable).  Suspect renal failure playing the major role here.  Dialysis pending today.  Wt up ~ 22 lbs since last admission.  7.  HTN:  stable.  8. DM:  Per IM.  9.  LLE Osteomyelitis/cellulitis:  Was not receiving abx as Rx @ home.  Resumed here per IM.   Electronic Signatures for Addendum Section:  Kathlyn Sacramento (MD) (Signed Addendum 15-Jun-15 18:28)  the patient was seen and examined. I agree with the above with the addition of: he has history of aortic valve replacement but the surgical details are not available. He presented with upper GI bleed with a drop of hemoglobin to 5.4 with INR slightly above 3. There is no definite mechanical click  by physical exam. Previous echocardiogram in April was reviewed. The replaced aortic valve was not well visualized in the apical views but seems to be a bioprosthetic valve. I recommend obtaining his previous surgical records to determine whether he needs long-term anticoagulation or not. In the meanwhile, I agree with holding warfarin given his degree of anemia.   Electronic Signatures: Kathlyn Sacramento (MD)  (Signed 15-Jun-15 18:28)  Co-Signer: General Aspect/Present Illness, History and Physical Exam, Review of System, Family & Social History, Past Medical History, Home Medications, Labs, EKG , Radiology, Allergies, Vital Signs/Nurse's Notes Rogelia Mire (NP)  (Signed 15-Jun-15 12:30)  Authored: General Aspect/Present  Illness, History and Physical Exam, Review of System, Family & Social History, Past Medical History, Home Medications, Labs, EKG , Radiology, Allergies, Vital Signs/Nurse's Notes, Impression/Plan   Last Updated: 15-Jun-15 18:28 by Kathlyn Sacramento (MD)

## 2015-02-20 NOTE — Discharge Summary (Signed)
PATIENT NAME:  Daniel Pennington, Daniel Pennington MR#:  [id number] DATE OF BIRTH:  05/06/1932  DATE OF ADMISSION:  04/12/2014 DATE OF DISCHARGE:  04/28/2014   Addendum  DISPOSITION: The patient will be going to rehab.  ADDENDUM: The patient will restart Coumadin on 04/30/2014. At that time, the patient needs an INR every 2 days and a CBC daily to monitor his hemoglobin and his Coumadin level. INR should be about 2.5. Prior to that, he will have followup with cardiology.   DRESSING OF WOUND: Cleanse left foot surgical sites with wound cleanser and pat gently dry. Gently fill wound bed with calcium alginate dressing, top with 4 x 4, Kerlix and tape. Change daily. Cleanse left leg ulcers with wound cleanser and pat gently dry. Apply Mepitel  silicone dressing to wound bed, moisturize legs bilaterally and wrap with Ace bandage for moderate light compression.  The patient was [facility] in the [facility].   ____________________________ Janyth ContesSital P. Juliene PinaMody, MD spm:lb D: 04/28/2014 09:44:42 ET T: 04/28/2014 09:49:24 ET JOB#: [id number]  cc: Daniel P. Juliene PinaMody, MD, <Dictator> Janyth ContesSITAL P MODY MD ELECTRONICALLY SIGNED 04/28/2014 12:26

## 2015-02-20 NOTE — Consult Note (Signed)
Brief Consult Note: Diagnosis: Status Post 5th ray amputation left foot.   Patient was seen by consultant.   Consult note dictated.   Recommend further assessment or treatment.   Comments: Plantar wound not healing.  Will consult vascular fo4r their input.  Electronic Signatures: Epimenio Sarinroxler, Geremy Rister G (MD)  (Signed 15-Jun-15 18:17)  Authored: Brief Consult Note   Last Updated: 15-Jun-15 18:17 by Epimenio Sarinroxler, Raeleigh Guinn G (MD)

## 2015-02-20 NOTE — Consult Note (Signed)
Brief Consult Note: Diagnosis: Osteomyelitis/Renal Failure/Thrombocytopenia.   Comments: Platelt count halved over past 3-4 days. Would recommend discontinuing heparin products. Patient does need anticoagulation due to vascular compromise as well as previous AVR. Would recommend Argatroban to start at 0.505mcg/kg/min with goal of PTT to 1.5-3 times normal not to exceed PTT of 100. If any active bleeding would transfuse platelets.  Electronic Signatures: Antony Hasteamiah, Zetta Stoneman S (MD)  (Signed 18-Jun-15 09:50)  Authored: Brief Consult Note   Last Updated: 18-Jun-15 09:50 by Antony Hasteamiah, Naziah Portee S (MD)

## 2015-02-20 NOTE — Consult Note (Signed)
CHIEF COMPLAINT and HISTORY:  Subjective/Chief Complaint Left foot ulceration Nonpalpable pedal pulses   History of Present Illness 81 year old African American male admitted 02/21/14 with lef foot ulceration x several months with foul smell and discharge. According to documentation he has previsouly gotten care from [facility] and his dressing hadn't been changed in almost 3 months.  He is s/p partial left 5th ray amputation yesterday by Dr. Ether Griffins. We were consulted for arterial evaluation for nonpalpable pedal pulses.   PAST MEDICAL/SURGICAL HISTORY:  Past Medical History:   Chronic Kidney Disease:    Diabetes Mellitus, Type II (NIDD):    Prostate Cancer:    GERD:    HTN:    CHF:    CABG:    Valve Replacement:    Rotator Cuff Surgery:   ALLERGIES:  Allergies:  Penicillin: Unknown  HOME MEDICATIONS:  Home Medications: Medication Instructions Status  ranitidine 150 mg oral tablet 1 tab(s) orally once a day Active  warfarin 5 mg oral tablet 1.5 tabs (7.5mg ) orally once a day on Sunday, Monday, Wednesday, Thursday, and Saturday. Take 2 tabs ( ) orally once a day on Tuesday and Friday. Active  acetaminophen-HYDROcodone 325 mg-5 mg oral tablet 1 tab(s) orally every 6 hours as needed for pain. Active  furosemide 40 mg oral tablet 1 tab(s) orally 2 times a day Active  Lubrifresh P.M. - ophthalmic ointment 1 application to each affected eye once a day (at bedtime) Active  lisinopril 20 mg oral tablet 2 tabs ( ) orally once a day Active  amLODIPine 10 mg oral tablet 1 tab(s) orally once a day Active   Family and Social History:  Family History Hypertension  Kidney disease   Social History negative tobacco, negative ETOH, negative Illicit drugs   Review of Systems:  Fever/Chills No   Cough No   Sputum No   Abdominal Pain No   Diarrhea No   Constipation No   Nausea/Vomiting No   SOB/DOE No   Chest Pain No   Physical Exam:  GEN no acute distress    HEENT PERRL, hearing intact to voice, moist oral mucosa   NECK supple  No masses   RESP normal resp effort  clear BS   CARD regular rate   ABD denies tenderness  soft  normal BS   SKIN Left foot dressed s/p surgery yesterday, unable to palpate pedal puses due to dressing   NEURO cranial nerves intact   PSYCH alert   LABS:  Laboratory Results: Routine Chem:    27-Apr-15 06:55, Prothrombin Time  Result Comment   PTT - RESULTS VERIFIED BY REPEAT TESTING.   - NOTIFIED OF CRITICAL VALUE   - READ-BACK PROCESS PERFORMED.   Salena Saner RACHAEL VERDI,RN @ 1610 02/23/14   - .Marland KitchenSRB   Result(s) reported on 23 Feb 2014 at 07:38AM.  Routine Coag:  Prothrombin 21.5  INR 1.9  INR reference interval applies to patients on anticoagulant therapy.  A single INR therapeutic range for coumarins is not optimal for all  indications; however, the suggested range for most indications is  2.0 - 3.0.  Exceptions to the INR Reference Range may include: Prosthetic heart  valves, acute myocardial infarction, prevention of myocardial  infarction, and combinations of aspirin and anticoagulant. The need  for a higher or lower target INR must be assessed individually.  Reference: The Pharmacology and Management of the Vitamin K   antagonists: the seventh ACCP Conference on Antithrombotic and  Thrombolytic Therapy. Chest.2004 Sept:126 (3suppl): [id number].  A HCT value >55% may artifactually increase the PT.  In one study,   the increase was an average of 25%.  Reference:  "Effect on Routine and Special Coagulation Testing Values  of Citrate Anticoagulant Adjustment in Patients with High HCT Values."  American Journal of Clinical Pathology 2006;126:400-405.  Routine Hem:    25-Apr-15 12:49, Hemogram, Platelet Count  WBC (CBC) 6.0  RBC (CBC) 3.25  Hemoglobin (CBC) 8.9  Hematocrit (CBC) 27.9  Platelet Count (CBC) 192  Result(s) reported on 21 Feb 2014 at 01:14PM.  MCV 86  MCH 27.2  MCHC 31.8  RDW 19.6    RADIOLOGY:  Radiology Results: XRay:    25-Apr-15 16:19, Foot Left AP and Lateral  Foot Left AP and Lateral  REASON FOR EXAM:    infection, ?osteomyelitis  COMMENTS:       PROCEDURE: DXR - DXR FOOT LEFT AP AND LATERAL  - Feb 21 2014  4:19PM     CLINICAL DATA:  Chronic wound.  Drainage.  Assess for osteomyelitis.    EXAM:  LEFT FOOT - 2 VIEW    COMPARISON:  None.    FINDINGS:  There is marked soft tissue swelling of the forefoot. There are  abnormal gas shadows within the soft tissues. The bones are  osteopenic. There is a high likelihood of osteomyelitis involving at  least the distal fifth and fourth metatarsals. There could be  involvement of the third and second. Destructive changes are present  affecting the distal fifth metatarsal. Regional vascular  calcifications are present. There is pes planus.     IMPRESSION:  Osteomyelitis certainly involving the distal fifth metatarsal.  Likely osteomyelitis of the distal fourth metatarsal. Possible  involvement of the distal third and second metatarsals. Regional  soft tissue swelling. Air/ gas shadows in the soft tissues. .      Electronically Signed    By: Paulina FusiMark  Shogry M.D.    On: 02/21/2014 16:23         Verified By: Thomasenia SalesMARK E. SHOGRY, M.D.,  LabUnknown:  PACS Image   ASSESSMENT AND PLAN:  Assessment/Admission Diagnosis Nonhealing wound of the left foot with osteomyelitis. S/p partial 5th ray amputation today. Nonpalpable pedal pulse. Chronic kidney disease.  D/w Dr. Wyn Quakerew. Recommend proceeding with angiogram to evaluate and optimize arterial flow for healing. Discussed with patient including risks and benefits. Renal protection with bicarb, vitamin c, and mucomyst discussed and started. Left leg angiogram today.   Electronic Signatures: Governor SpeckingHanne, Iviana Blasingame Nicole (PA-C)  (Signed 27-Apr-15 09:06)  Authored: Chief Complaint and History, PAST MEDICAL/SURGICAL HISTORY, ALLERGIES, HOME MEDICATIONS, Family and Social  History, Review of Systems, Physical Exam, LABS, RADIOLOGY, Assessment and Plan   Last Updated: 27-Apr-15 09:06 by Governor SpeckingHanne, Tyshawna Alarid Nicole (PA-C)

## 2015-02-20 NOTE — Discharge Summary (Signed)
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 ARF over CKD4 *  Acute on chronic diastolic CHF: * hyperkalemia * pAfib * Constipation * Acute blood loss anemia over AOCD    Chief Complaint/History of Present Illness CHIEF COMPLAINT: Left foot discharge, foul smelling.   HISTORY OF PRESENT ILLNESS:  A 81 year old African-American male patient with history of diabetes, hypertension, CKD stage III, and history of aortic valve replacement on Coumadin, presents to the Emergency Room complaining of left foot foul-smelling discharge, which has not improved in months. The patient mentions that in January he had a callus, but he continued to walk with the callus, and secondary to poor wound care, which he blames on [place], the patient had worsening symptoms and is at the Emergency Room today. The patient was offered transfer to [facility], but he has refused, stating that he has not received good care on multiple occasions, and does not want to be transferred, and wants to stay here and get his treatment.   Here in the Emergency Room, patient has been found to be afebrile, with white blood cell count of 6, and left foot x-ray showing osteomyelitis of the distal fifth metatarsal and possibly distal fourth and also third metatarsals, and is being admitted to the hospitalist service.   The patient did see Dr. Vickki Muff of Podiatry previously in town here, but later was told by [facility] to get his care at [facility]. His last dressing was on 02/02/2014, and has not been changed in almost 3 weeks.   Allergies:  Penicillin: Unknown  Routine Chem:  13-May-15 04:58   Glucose, Serum  154  BUN  35  Creatinine (comp)  3.54  Sodium, Serum 140  Potassium, Serum 3.7   Chloride, Serum 100  CO2, Serum  34  Calcium (Total), Serum  8.3  Anion Gap  6  Osmolality (calc) 290  eGFR (African American)  18  eGFR (Non-African American)  15 (eGFR values <72m/min/1.73 m2 may be an indication of chronic kidney disease (CKD). Calculated eGFR is useful in patients with stable renal function. The eGFR calculation will not be reliable in acutely ill patients when serum creatinine is changing rapidly. It is not useful in  patients on dialysis. The eGFR calculation may not be applicable to patients at the low and high extremes of body sizes, pregnant women, and vegetarians.)  Result Comment LABS - This specimen was collected through an   - indwelling catheter or arterial line.  - A minimum of 534m of blood was wasted prior    - to collecting the sample.  Interpret  - results with caution.  Result(s) reported on 11 Mar 2014 at 05:32AM.  14-May-15 05:34   Creatinine (comp)  3.54  eGFR (African American)  18  eGFR (Non-African American)  15 (eGFR values <6017min/1.73 m2 may be an indication of chronic kidney disease (CKD). Calculated eGFR is useful in patients with stable renal function. The eGFR calculation will not be reliable in acutely ill patients when serum creatinine is changing rapidly. It is not useful in  patients on dialysis. The eGFR calculation may not be applicable to patients at the low and high extremes of body sizes, pregnant women, and vegetarians.)  Result Comment LABS -  This specimen was collected through an   - indwelling catheter or arterial line.  - A minimum of 81ms of blood was wasted prior    - to collecting the sample.  Interpret  - results with caution.  Result(s) reported on 12 Mar 2014 at 06:43AM.  15-May-15 05:38   Glucose, Serum  124  BUN  49  Creatinine (comp)  3.66  Sodium, Serum 138  Potassium, Serum 4.0  Chloride, Serum 100  CO2, Serum  33  Calcium (Total), Serum  8.3  Anion Gap  5  Osmolality (calc) 290  eGFR  (African American)  17  eGFR (Non-African American)  15 (eGFR values <686mmin/1.73 m2 may be an indication of chronic kidney disease (CKD). Calculated eGFR is useful in patients with stable renal function. The eGFR calculation will not be reliable in acutely ill patients when serum creatinine is changing rapidly. It is not useful in  patients on dialysis. The eGFR calculation may not be applicable to patients at the low and high extremes of body sizes, pregnant women, and vegetarians.)  Result Comment prothrombin - This specimen was collected through an   - indwelling catheter or arterial line.  - A minimum of 79m60mof blood was wasted prior    - to collecting the sample.  Interpret  - results with caution.  Result(s) reported on 13 Mar 2014 at 06:36AM.  Routine Coag:  13-May-15 04:58   Prothrombin  24.8  INR 2.3 (INR reference interval applies to patients on anticoagulant therapy. A single INR therapeutic range for coumarins is not optimal for all indications; however, the suggested range for most indications is 2.0 - 3.0. Exceptions to the INR Reference Range may include: Prosthetic heart valves, acute myocardial infarction, prevention of myocardial infarction, and combinations of aspirin and anticoagulant. The need for a higher or lower target INR must be assessed individually. Reference: The Pharmacology and Management of the Vitamin K  antagonists: the seventh ACCP Conference on Antithrombotic and Thrombolytic Therapy. CheOEVOJ.5009pt:126 (3suppl): [id number] HCT value >55% may artifactually increase the PT.  In one study,  the increase was an average of 25%. Reference:  "Effect on Routine and Special Coagulation Testing Values of Citrate Anticoagulant Adjustment in Patients with High HCT Values." American Journal of Clinical Pathology 2006;126:400-405.)  14-May-15 05:34   Prothrombin  27.3  INR 2.6 (INR reference interval applies to patients on anticoagulant therapy. A  single INR therapeutic range for coumarins is not optimal for all indications; however, the suggested range for most indications is 2.0 - 3.0. Exceptions to the INR Reference Range may include: Prosthetic heart valves, acute myocardial infarction, prevention of myocardial infarction, and combinations of aspirin and anticoagulant. The need for a higher or lower target INR must be assessed individually. Reference: The Pharmacology and Management of the Vitamin K  antagonists: the seventh ACCP Conference on Antithrombotic and Thrombolytic Therapy. CheFGHWE.9937pt:126 (3suppl): [id number] HCT value >55% may artifactually increase the PT.  In one study,  the increase was an average of 25%. Reference:  "Effect on Routine and Special Coagulation Testing Values of Citrate Anticoagulant Adjustment in Patients with High HCT Values." American Journal of Clinical Pathology 2006;126:400-405.)  15-May-15 05:38   Prothrombin  29.4  INR 2.9 (INR reference interval applies to patients on anticoagulant therapy. A single INR therapeutic range for coumarins is not optimal for all indications; however, the suggested range for most indications is 2.0 - 3.0. Exceptions to the INR Reference Range may include: Prosthetic heart valves,  acute myocardial infarction, prevention of myocardial infarction, and combinations of aspirin and anticoagulant. The need for a higher or lower target INR must be assessed individually. Reference: The Pharmacology and Management of the Vitamin K  antagonists: the seventh ACCP Conference on Antithrombotic and Thrombolytic Therapy. HKVQQ.5956 Sept:126 (3suppl): [id number]. A HCT value >55% may artifactually increase the PT.  In one study,  the increase was an average of 25%. Reference:  "Effect on Routine and Special Coagulation Testing Values of Citrate Anticoagulant Adjustment in Patients with High HCT Values." American Journal of Clinical Pathology 2006;126:400-405.)  Routine  Hem:  13-May-15 04:58   Hemoglobin (CBC)  8.1  14-May-15 05:34   Hemoglobin (CBC)  8.0 (Result(s) reported on 12 Mar 2014 at 07:49AM.)   Pertinent Past History:  Pertinent Past History PAST MEDICAL HISTORY: 1.  Diabetes mellitus.  2.  Hypertension.  3.  CKD stage III.  4.  Diastolic CHF.   5.  GERD.  6.  Prostate cancer.  7.  Aortic valve replacement - metallic.  8.  Rotator cuff surgery.  9.  CABG.   Hospital Course:  Hospital Course * MRSA osteomyelitis  -s/p amputation 5th metatarsal - s/p angoplasty LA tibial artery - ON Meropenem and Daptomycin in hospital. - picc line placed Abx till 04/05/2014. Discussed with Dr. Ola Spurr. Daptomycin, Cipro, Flagyl at d/c till 04/05/2014 to finish 6 weeks course.  * Hematuria - Resolved. Due to foley trauma on anti-coagulation. s/p CBI Hb stable.  * hx AVR metallic -  - coumadin 7.5 mg qhs, INR 2.6  * HTN  Meds held as BP in normal range  *  ESRD. ARF over CKD4 Likely from contrast with  Angio - s/p dialysis catheter  - dialysis OP after d/c. -f/u vascular for PAD and AVF  *  Acute diastolic CHF: -ECHO shows nml EF but significant diastolic dysfunction - on torsemide - no b-blocker with bradycardia/ hypotension -Improving with HD  * hyperkalemia- Resolved with HD  * Atrial fibrillation new but pt may have experience these episodes previously as outpatient -HR controlled on no meds  * abd distension on admission imrpoved after HD -likely from CHF cont HD  and torsemide  * acute blood loss anemia -transfused 2 units total EGD showed no bleeding/ulcers  On day of discharge S1, S2 Lungs CTA Abd- Distended, soft, NT LLE dressing over surgical site  Time spent on day of d/c 40 minutes   Condition on Discharge Fair   Code Status:  Code Status Full Code   DISCHARGE INSTRUCTIONS HOME MEDS:  Medication Reconciliation: Patient's Home Medications at Discharge:     Medication Instructions  lubrifresh p.m. -  ophthalmic ointment  1 application to each affected eye once a day (at bedtime)   ranitidine 150 mg oral tablet  1 tab(s) orally once a day   acetaminophen-hydrocodone 325 mg-5 mg oral tablet  1 tab(s) orally every 6 hours, As Needed as needed for pain.   warfarin 1 mg oral tablet  7.5 tab(s) orally once a day   torsemide 20 mg oral tablet  2 tab(s) orally once a day   iron polysaccharide (as elemental iron) 150 mg oral capsule  1 cap(s) orally 2 times a day   polyethylene glycol 3350 oral powder for reconstitution  17 gram(s) orally once a day, As needed, constipation   daptomycin  600 milligram(s) intravenous every 48 hours with dialysis   simethicone 80 mg oral tablet, chewable  1 tab(s) orally every 8 hours, As Needed -  gas   pantoprazole 40 mg oral delayed release tablet  1 tab(s) orally 2 times a day   cipro 250 mg oral tablet  1 tab(s) orally once a day   flagyl 500 mg oral tablet  1 tab(s) orally 2 times a day    PRESCRIPTIONS: PRINTED AND PLACED ON CHART  STOP TAKING THE FOLLOWING MEDICATION(S):    furosemide 40 mg oral tablet: 1 tab(s) orally 2 times a day lisinopril 20 mg oral tablet: 2 tabs (70m) orally once a day amlodipine 10 mg oral tablet: 1 tab(s) orally once a day  Physician's Instructions:  Diet Renal Diet   Activity Limitations As tolerated  with walker   Return to Work Not Applicable   Time frame for Follow Up Appointment 1-2 weeks  Dr. FOla Spurrin 2 weeks   Time frame for Follow Up Appointment 1-2 weeks  Nephrology Dr. lHolley Raring  Time frame for Follow Up Appointment 1-2 weeks  Podiatry Dr. FVickki Muff Dr. DLucky Cowboy  Other Comments Conitnue Daptomycin, Cipro, Flagyl till pt sees Dr. FOla Spurrfor further Abx changes   Electronic Signatures: Daylynn Stumpp, SLottie Dawson(MD)  (Signed 15-May-15 14:04)  Authored: ADMISSION DATE AND DIAGNOSIS, CHIEF COMPLAINT/HPI, Allergies, PERTINENT LABS, [place] PATIENT  INSTRUCTIONS   Last Updated: 15-May-15 14:04 by SAlba Destine(MD)

## 2015-02-20 NOTE — H&P (Signed)
PATIENT NAME:  Daniel Pennington, Daniel Pennington MR#:  [id number] DATE OF BIRTH:  [birth year]  DATE OF ADMISSION:  02/21/2014  PRIMARY CARE PROVIDER: [facility]  CHIEF COMPLAINT: Left foot discharge, foul smelling.   HISTORY OF PRESENT ILLNESS:  A 81 year old African-American male patient with history of diabetes, hypertension, CKD stage III, and history of aortic valve replacement on Coumadin, presents to the Emergency Room complaining of left foot foul-smelling discharge, which has not improved in months. The patient mentions that in January he had a callus, but he continued to walk with the callus, and secondary to poor wound care, which he blames on [place], the patient had worsening symptoms and is at the Emergency Room today. The patient was offered transfer to [facility], but he has refused, stating that he has not received good care on multiple occasions, and does not want to be transferred, and wants to stay here and get his treatment.   Here in the Emergency Room, patient has been found to be afebrile, with white blood cell count of 6, and left foot x-ray showing osteomyelitis of the distal fifth metatarsal and possibly distal fourth and also third metatarsals, and is being admitted to the hospitalist service.   The patient did see Dr. Vickki Muff of Podiatry previously in town here, but later was told by [facility] to get his care at [facility]. His last dressing was on 02/02/2014, and has not been changed in almost 3 weeks.   PAST MEDICAL HISTORY: 1.  Diabetes mellitus.  2.  Hypertension.  3.  CKD stage III.  4.  Diastolic CHF.   5.  GERD.  6.  Prostate cancer.  7.  Aortic valve replacement - metallic.  8.  Rotator cuff surgery.  9.  CABG.   SOCIAL HISTORY: The patient ambulates with a walker at home. Uses a scooter to get out of the house. Does not smoke. No alcohol. No illicit drugs.   ALLERGIES: PENICILLIN.  FAMILY HISTORY: Hypertension and kidney disease.   REVIEW OF SYSTEMS: CONSTITUTIONAL: Complains of  fatigue, weakness.  EYES: No blurred vision, pain, redness.  ENT: No tinnitus, ear pain, hearing loss.  RESPIRATORY: No cough, sob CARDIOVASCULAR: Had aortic valve replacement. No chest pain.  GASTROINTESTINAL: No nausea, vomiting, diarrhea, abdominal pain.  GENITOURINARY: Has had problems with his prostate, and also penile swelling.  MUSCULOSKELETAL: No joint swelling, redness, effusion.  SKIN: Has multiple ulcers in lower extremity, with thickening skin, which is chronic in the extremities, with status changes.  NEUROLOGIC: No focal deficit.  PSYCHIATRIC: No anxiety or depression.   HOME MEDICATIONS: 1.  Warfarin 7.5 mg daily.  2.  Acetaminophen/hydrocodone 325/5, 1 tablet every 6 hours as needed.  3.  Amlodipine 10 mg day.  4.  Lasix 40 mg 2 times a day.  5.  Lisinopril 40 mg daily.    PHYSICAL EXAMINATION: VITAL SIGNS: Shows temperature 97.8, pulse of 54, respirations 18, blood pressure 147/73, saturating 93% on room air.  GENERAL: A morbidly obese, African-American male patient lying in bed. Seems comfortable, conversational, cooperative with exam.  PSYCHIATRIC: Alert and oriented x 3. Mood and affect appropriate. Judgment intact.  HEENT: Atraumatic, normocephalic. Mucous membranes moist and pink. External nose normal. No pallor or icterus. Pupils bilaterally equal and react to light.  NECK: Supple. No thyromegaly. No palpable lymph nodes. Trachea midline. No carotid bruit or JVD.  CARDIOVASCULAR: S1, S2, with metallic sound, systolic murmur. Peripheral pulses decreased in lower extremities. No edema.  RESPIRATORY: Normal work of breathing. Clear  to auscultation on both sides.   GASTROINTESTINAL: Soft abdomen, nontender. Bowel sounds present. No organomegaly palpable.  GENITOURINARY: Has penile swelling, although the skin is retractable. No erythema, redness. Not tender.  MUSCULOSKELETAL: No joint swelling, redness of the large joints. Normal muscle tone.  NEUROLOGICAL: Motor  strength 5/5 in upper and lower extremities.  SKIN: Has significant skin excoriation and ulceration of the left foot over the toes. Foul-smelling discharge. Has dry gangrene of the toes.   LABORATORY STUDIES: Show glucose 145, BUN 46, creatinine 2.77, sodium 142, potassium 4.7, chloride 114, GFR 21. AST, ALT, alk phos, bilirubin normal. WBC 6, hemoglobin 8.9, platelets of 192.   X-ray of the left foot shows osteomyelitis of distal fifth metatarsal, and also possible involvement of the distal fourth, third, and second metatarsals. Also soft tissue swelling.   ASSESSMENT AND PLAN: 1. Left foot osteomyelitis of the multiple metatarsals. Will start patient broadly on vancomycin, Zosyn. Get wound care consult. Also consult Podiatry for debridement. The patient will need amputation of these areas. Will await culture results from blood, and also intraoperative samples of the bone. Discussed this with the patient, who is fine with getting surgery here in the hospital. Does not want to be transferred to [facility]. The patient does have history of valve replacement in the past. He is on Coumadin. Will check PT, INR, and will transition him to heparin drip, depending on his INR levels, and stop Coumadin in anticipation of surgery soon.   2.  Chronic kidney disease stage IV. Seems stable, creatinine is improved from past records.   3.  Diabetes mellitus. Sliding scale insulin. Diabetic diet.   4.  Hypertension. Continue medications.   5.  Anemia of chronic disease. Seems worsened from 10 to around 9 at this time. Needs to be monitored. No need of transfusion at this time.   6. Aortic valve replacement. The patient will need anticoagulation. Will await PT/INR, and heparin if INR is low, and hold Coumadin.   7.  Deep vein thrombosis prophylaxis. The patient is on Coumadin.   8.  CODE STATUS: Full code.   Time spent on this case was 45 minutes.    ____________________________ Leia Alf Creighton Longley,  MD srs:mr D: 02/21/2014 17:41:00 ET T: 02/21/2014 19:12:03 ET JOB#: [id number]  cc: Alveta Heimlich R. Tomeshia Pizzi, MD, <Dictator> Justin A. Vickki Muff, Brimhall Nizhoni MD ELECTRONICALLY SIGNED 02/22/2014 13:30

## 2015-02-20 NOTE — Op Note (Signed)
PATIENT NAME:  Daniel Pennington, Daniel Pennington MR#:  [id number] DATE OF BIRTH:  07/02/1932  DATE OF PROCEDURE:  02/24/2014  PREOPERATIVE DIAGNOSES:   1. Osteomyelitis.  2. Renal failure.  3. Peripheral arterial disease.   POSTOPERATIVE DIAGNOSES:  1. Osteomyelitis.  2. Renal failure.  3. Peripheral arterial disease.   PROCEDURES:  1. Ultrasound guidance for vascular access to right brachial vein.  2. Fluoroscopic guidance for placement of catheter.  3. Insertion of peripherally inserted central venous catheter, double-lumen, right arm.  SURGEON: Annice NeedyJason S. Liron Eissler, M.D.   ANESTHESIA: Local.   ESTIMATED BLOOD LOSS: Minimal.   INDICATION FOR PROCEDURE: A 81 year old [place] male with multiple ongoing issues is admitted with osteomyelitis of the foot. He has peripheral arterial disease, status post intervention. He has renal failure. He will require extended IV therapy.   DESCRIPTION OF PROCEDURE: The patient's right arm was sterilely prepped and draped, and a sterile surgical field was created. The right brachial vein was accessed under direct ultrasound guidance without difficulty with a micropuncture needle and permanent image was recorded. 0.018 wire was then placed into the superior vena cava. Peel-away sheath was placed over the wire. A single lumen peripherally inserted central venous catheter was then placed over the wire and the wire and peel-away sheath were removed. The catheter tip was placed into the superior vena cava and was secured at the skin at 25 cm with a sterile dressing. The catheter withdrew blood well and flushed easily with heparinized saline. The patient tolerated procedure well.  ____________________________ Annice NeedyJason S. Danyella Mcginty, MD jsd:gb D: 03/05/2014 16:09:05 ET T: 03/06/2014 03:01:50 ET JOB#: [id number]  cc: Annice NeedyJason S. Anysa Tacey, MD, <Dictator> Annice NeedyJASON S Junice Fei MD ELECTRONICALLY SIGNED 03/12/2014 15:34

## 2015-02-20 NOTE — H&P (Signed)
PATIENT NAME:  Daniel Pennington, Daniel Pennington MR#:  [id number] DATE OF BIRTH:  07-10-1932  DATE OF ADMISSION:  04/12/2014  ADMITTING PHYSICIAN:  Gladstone Lighter, MD  PRIMARY PHYSICIAN: None.    CHIEF COMPLAINT: Difficulty breathing, abdominal distention and melena.  HISTORY OF PRESENT ILLNESS: Daniel Pennington is a 81 year old African-American male with multiple medical problems, who had a recent prolonged hospitalization here at [facility] from end of April to mid-May, and was discharged to [facility] rehab on 03/13/2014. Comes to the hospital complaining of above-mentioned symptoms. The patient is very hard of hearing and a poor historian. He says he was originally a [place] patient, but does not want to be admitted there due to poor care during previous hospitalizations. The last admission he was here 3 weeks ago, he had left foot osteomyelitis, for which left fifth toe was amputated. He had angioplasty done for his left tibial artery. He had CKD stage IV, and was started on hemodialysis here in the hospital. He had anemia of chronic disease, he was on daptomycin, Cipro, Flagyl p.o. PICC line was placed, and was sent to rehab. He was supposed to be on antibiotics until 04/05/2014, based on discharge instructions. However, patient says he was discharged from the rehab on the 26th of May, and since then he has not had antibiotics. He was supposed to be seen by home health, physical therapy, nursing. However, the home health nurse came in and said that she was not supposed to do antibiotics on him. She was only supposed to see  patients who were bedbound and not him, and signed off. Since then, nobody came to the home for the last 3 weeks.   The patient has not had the wound dressing on his leg changed, PICC line dressing changed. None of the antibiotics were given. He still has up to 9 to 10 daptomycin prefilled syringes with him today, which have not been used. He says his abdomen has gradually been distended. He has poor p.o. intake. He  feels gassy and is having a lot of belching. He is constipated and also feels like he is nauseous and could not swallow anything. His ulcers have been getting worse. He has been having stinging pain in his left foot since the surgery. He also had complained of dark stools. His baseline hemoglobin seems to be around 8. However, today the hemoglobin is around 5.4. His blood pressure is slightly on the lower side, his pulse is bradycardic, and he is being admitted for all these multiple complaints. Also, he has not been dialyzed since his discharge here. He says he makes only minimal urine. He has blood clots in the bladder, for which he was seen by Urology last admission. However, he has not been dialyzed, but his potassium seems to be stable, though his creatinine is worse.   PAST MEDICAL HISTORY: 1.  Acute on chronic renal insufficiency progressing to hemodialysis last admission.  2.  CKD stage IV at baseline.  3.  Left fifth metatarsal osteomyelitis, status post amputation.  4.  Left tibial artery angioplasty.  5.  History of mechanical aortic valve replacement.  6.  Hypertension.  7.  Diastolic CHF.   8.  Urinary retention.  9.  Hematuria.  10.  Paroxysmal atrial fibrillation.  11.  Abdominal distention.  12.  Anemia of chronic disease.  13.  Hematuria.  14.  Constipation.   PAST SURGICAL HISTORY: 1.  Angioplasty to the left leg.  2.  Left fifth toe metatarsal amputation.  3.  History  of prostate cancer.  4.  Left shoulder rotator cuff surgery.  5.  Aortic valve replacement surgery.  ALLERGIES TO MEDICATIONS: PENICILLIN.   CURRENT MEDICATIONS:  He is on the following medications listed from [facility] rehab. Not sure, again, if he is taking all of these or not. He is supposed to be getting  1.  Daptomycin 600 mg q. 48 hours with hemodialysis.  2.  Lasix 40 mg p.o. b.i.d.  3.  Iron-polysaccharide 150 mg p.o. 3 times a day.  4.  Ranitidine 150 mg p.o. daily.  5.  Sodium bicarbonate 650  mg p.o. b.i.d.  6.  Warfarin 10 mg on Tuesday and Friday.  7.  Warfarin 7.5 mg on Monday, Wednesday, Thursday, Saturday and Sunday.   SOCIAL HISTORY: Before admission to the hospital, it seems the patient was ambulating with a walker. However, since his surgery, he has been bedbound. No smoking or alcohol history.   FAMILY HISTORY:  Hypertension and kidney disease running in the family.  REVIEW OF SYSTEMS:  CONSTITUTIONAL: Positive for fatigue, weakness, pain. EYES: No blurred vision, double vision. Positive for right eye cataract surgery. No inflammation.  EARS, NOSE, THROAT: No tinnitus, ear pain, hearing loss, epistaxis, or discharge. Positive for dysphagia.  RESPIRATORY:  Positive for cough. No COPD, wheeze or hemoptysis.  CARDIOVASCULAR: Positive for chest pressure. No orthopnea, edema, arrhythmia, palpitations, or syncope.  GASTROINTESTINAL: Positive for nausea, abdominal pain. No vomiting. No diarrhea. Positive for constipation and melena. No hematemesis.  GENITOURINARY: Positive for urinary retention, hematuria, and also oliguria.   ENDOCRINE: No polyuria, nocturia, thyroid problems, heat or cold intolerance.  HEMATOLOGY: Positive for anemia, easy bruising and bleeding.  SKIN: Multiple rashes and ulcers seen on bilateral lower extremities.  MUSCULOSKELETAL: Positive for arthritis. No gout.  NEUROLOGIC: No history of CVA, TIA, or seizures.  PSYCHOLOGICAL: No anxiety, insomnia, or depression.   PHYSICAL EXAMINATION: VITAL SIGNS: Temperature 97.8 degrees Fahrenheit, pulse 55, respirations 22, blood pressure 106/46, pulse ox 98% on room air.  GENERAL: A disheveled-appearing, well-built, well-nourished male lying in bed, not in any acute distress.  HEENT: Normocephalic, atraumatic. Pupils equal, round, reacting to light. Muddy conjunctiva, anicteric sclerae. Extraocular movements intact. Oropharynx: Poor dentition, otherwise clear without erythema, mass or exudates.  NECK: Supple. No  thyromegaly, JVD or carotid bruits. No lymphadenopathy.  LUNGS: Moving air bilaterally. Fine bibasilar crackles are heard. No rubs or gallops. No use of accessory muscles for breathing.  CARDIOVASCULAR: S1, S2. Regular rate and rhythm. A loud 3/6 systolic murmur heard in the aortic area. ABDOMEN: Distended. However, there is no tenderness. Hyperactive bowel sounds are present.  EXTREMITIES: He has 4+ edema all the way up to his thighs. He has open oscillations with good granulation tissue on the left leg. Thickening of his skin noted, and dressing in place for his amputation of left fifth metatarsal.  LYMPHATICS: No cervical lymphadenopathy.  NEUROLOGICAL: Cranial nerves intact. No focal motor or sensory deficits.  PSYCHOLOGICAL: The patient is awake, alert, oriented x 3.   LAB DATA: INR 3.4. Troponin 0.07. Sodium 142, potassium 4.9, chloride 116, bicarb 18, BUN 117, creatinine 4.8, glucose 146, calcium of 3.0. ALT is 11, AST 21, alk phos 89, total bili 0.4, albumin of 3.7. WBC 4.9, hemoglobin 5.4, hematocrit 16.9, platelet count 141. BNP is elevated at 8925. Chest x-ray is showing plate-like areas of atelectasis bilaterally, cardiac enlargement, and some pulmonary vascular congestion noted. EKG showing wide complex QRS rhythm with PVCs, bradycardia, heart rate of 55.   ASSESSMENT  AND PLAN: This is a 81 year old, African-American male with multiple medical problems, who has been at [facility] for almost a month, from April 2015, and discharged to rehab on 03/13/2014. Brought back from home now secondary to dyspnea, melena, noted to have acute renal failure, hemoglobin of 5.4.   1.  Acute on chronic anemia. The patient has history of anemia of chronic disease, hemoglobin at baseline during last admission, seems to be around 8. He is down to 5.4 now, with melena, likely GI bleed. He also had EGD, colonoscopy done last admission, and it showing only polyps. However, patient also on Coumadin for his mechanical  valve, and INR is at 3.4. Two units of packed RBCs  are being transferred, and GI has been consulted. Placed on Protonix drip.   2.  Abdominal distention and nausea. Also complains of dysphagia. CT of the abdomen is pending. Will keep him n.p.o. until  GI has been consulted, and further consultations as needed, based on the CT results. Give IV Zofran p.r.n.   3.  Left foot osteomyelitis. He was just discharged on daptomycin, Cipro p.o., and Flagyl p.o., to be used until 04/05/2014. He also has a PICC line in place. However, patient says he was never given any medicine because it is supposed to be at dialysis, and then home health nurse never gave him any antibiotics. He still has his daptomycin prefilled syringes with him,. Will restart  Those medications. Will get Dr. Ola Spurr on board again. Also consult Podiatry for dressings and further management, since his dressings have not been changed at all for 3 weeks now. The patient also had angioplasty done for his tibial artery on the left last admission. Has multiple open wounds. Will get a wound care consult.   4.  Acute renal failure, on chronic kidney disease stage IV. Progressed to end-stage renal disease. Initiated on hemodialysis last admission. Never followed up for dialysis after discharge.  Surprisingly, electrolytes are well, potassium within normal limits. No urgent indication for dialysis tonight. Nephrology has been consulted, and significant dependent edema is noted, with minimal urine output, especially if he needs blood to be transfused, might need dialysis again.    5. Mechanical aortic valve. Hold Coumadin due to GI bleed and anemia. Will need anticoagulation with his mechanical valve. Will consult Cardiology, especially aortic valve being the high flow valve. Hopefully  risk of thrombus is relatively low. INR today is 3.4.    6.  Urinary retention and clots last admission. Was seen by Urology, now stable.   7.  Atrial fibrillation.  Monitor on telemetry. Rate controlled for now. Sinus brady.    Patient has multiple medical issues, is critically ill and high risk for cardiorespiratory arrest.  for   He is a FULL CODE.  Total critical care time spent on admitting this patient is 65 minutes.     ____________________________ Gladstone Lighter, MD rk:mr D: 04/12/2014 16:30:57 ET T: 04/12/2014 17:06:05 ET JOB#: [id number]  cc: Gladstone Lighter, MD, <Dictator> Gladstone Lighter MD ELECTRONICALLY SIGNED 04/20/2014 16:30

## 2015-02-20 NOTE — Consult Note (Signed)
patient on CBI.  urine light pink with slow CBI.  hgb stable current management  Electronic Signatures: Janit BernMcNamara, Alyssandra Hulsebus R (MD)  (Signed on 09-May-15 11:10)  Authored  Last Updated: 09-May-15 11:10 by Janit BernMcNamara, Arrington Yohe R (MD)

## 2015-02-20 NOTE — Consult Note (Signed)
Pt not actively bleeding- based on cardiology recc will cancel cystoscopy which anesthesia recc.  Electronic Signatures: Riki AltesStoioff, Scott C (MD)  (Signed on 17-Jun-15 10:53)  Authored  Last Updated: 17-Jun-15 10:53 by Riki AltesStoioff, Scott C (MD)

## 2015-02-20 NOTE — Op Note (Signed)
PATIENT NAME:  Daniel Pennington, Daniel Pennington MR#:  [id number] DATE OF BIRTH:  1932-03-15  DATE OF PROCEDURE:    PREOPERATIVE DIAGNOSIS:  End stage renal disease.  POSTOPERATIVE DIAGNOSIS:  End stage renal disease.  PROCEDURE PERFORMED:  Right femoral vein Trialysis catheter.    INDICATIONS:  The patient is now in need of starting dialysis.  I discussed with the patient and his son via telephone the details of the procedure.  Informed consent was obtained.   PROCEDURE:  The procedure was performed at the patient's bedside.  The right groin was prepped and draped in the usual sterile fashion.  1% lidocaine was used for local anesthesia.  A #11 blade was used to make a small skin nick in the right groin.  The right common femoral vein was cannulated without difficulty.  An 035 guidewire was then advanced without resistance.  Sequential dilators were used to dilate the subcutaneous tract.  A 30 cm Trialysis catheter was then inserted without resistance.  All 3 ports were flushed and aspirated and then filled with heparin saline.  The catheter was sutured into position with 3-0 nylon.  There was some oozing from around the skin and therefore I placed a U stitch around the catheter which became hemostatic.  Sterile dressings were then applied.  There were no immediate complications.    ____________________________ Nada LibmanVance W. Brabham, MD vwb:ea D: 02/28/2014 21:32:35 ET T: 03/01/2014 06:21:18 ET JOB#: [id number]  cc: Nada LibmanVance W. Brabham, MD, <Dictator> Nada LibmanVANCE W BRABHAM MD ELECTRONICALLY SIGNED 03/09/2014 20:11

## 2015-02-20 NOTE — Consult Note (Signed)
PATIENT NAME:  Daniel Pennington, Daniel Pennington MR#:  [id number] DATE OF BIRTH:  April 24, 1932  DATE OF CONSULTATION:  04/13/2014  REFERRING PHYSICIAN:   CONSULTING PHYSICIAN:  Ezzard Standing. Oh, MD  REASON FOR REFERRAL: Abdominal distention and melena.   HISTORY OF PRESENT ILLNESS: The patient is a 81 year old African American male with multiple medical history including chronic renal disease and heart condition including CHF who had a prolonged hospitalization recently for difficulty breathing. The patient is rather hard of hearing, is a poor historian, therefore it was difficult to get a good history. Apparently after she was discharged she went to [facility] rehab. She had a bout of osteomyelitis requiring left fifth toe to be amputated. She had dialysis started in the hospital. According to her, she has not received any antibiotics after discharge. She was also supposed to be seen by home health as well as physical therapy. According to the patient nothing was done. As a result, the patient came in for further carer. She has been having decreased appetite. The patient has been complaining of increasing abdominal distention. Other complaints include nausea and dysphagia and worsening constipation. She feels bloated. She is having more pain in her left foot after the surgery. She also mentions seeing some "dark stool". She is chronically anemic with baseline hemoglobin around 8. When she came in this time, her hemoglobin was down to 5.4.   When she was here last time, she was evaluated by Dr. Servando Snare for "melena and anemia."  Coumadin was held prior to endoscopy. Upper endoscopy showed a small polyp that was not bleeding. Polyp was not removed because of the fact the patient was on chronic anticoagulation. Colonoscopy was attempted, but it could not be done because of poor prep.   PAST MEDICAL HISTORY: She has acute on chronic renal insufficiency requiring hemodialysis on last admission. She has left fifth metatarsal osteomyelitis  requiring amputation. Other history includes angioplasty of the left tibia artery. She has mechanical aortic valve replacement. Other history includes hypertension, paroxysmal atrial fibrillation and hematuria.   PAST SURGICAL HISTORY: Includes angioplasty to the left leg, left fifth toe metatarsal amputation, history of rotator cuff surgery and aortic valve replacement surgery.  MEDICATIONS: The patient is on daptomycin 600 mg every 48 hours, Lasix 40 mg twice a day,  150 mg 3 times a day of iron, Zantac 150 mg daily, sodium bicarbonate twice a day, Coumadin 10 mg on Tuesday and Friday and 7.5 mg on other days of the week.   SOCIAL HISTORY: Ever since the toe surgery she has been bed bound. There is no alcohol or tobacco.   FAMILY HISTORY: Notable for kidney disease and hypertension.   REVIEW OF SYSTEMS: Please refer to the initial H and P. There are no changes since.   PHYSICAL EXAMINATION: GENERAL: The patient is in no acute distress.  VITAL SIGNS: Stable. The patient is afebrile.  HEAD AND NECK: Within normal limits.  HEART: Reveals somewhat irregular rhythm. There is a systolic murmur.  LUNGS: Clear bilaterally.  ABDOMEN: Distended but is nontender. There are hyperactive bowel sounds.  EXTREMITIES: Showed diffuse edema bilaterally. There is a dressing in place on the left foot where the amputation was done.  NEUROLOGIC: Nonfocal.   DIAGNOSTIC DATA: Troponin level was 0.07. Sodium 142, potassium 4.9, bicarb 18, BUN 117, creatinine 4.8, ALT 11, AST 21, total bilirubin was 0.4. White count 4.9, hemoglobin 5.4, and platelet count 151,000. BNP is 8925.   Chest x-ray shows some atelectasis and cardiac enlargement. EKG shows  some wide complex QRS with some PVCs.   ASSESSMENT AND PLAN: This is an elderly patient with multiple medical history requiring long-term hospitalization previously. The patient has acute on chronic anemia. The patient will require a blood transfusion today. INR is still  elevated at 3.4 due to the Coumadin. We will consider repeating the upper endoscopy or even colonoscopy if the patient continues to bleed. Hopefully, once the Coumadin is stopped the bleeding will stop on its own. The INR has to be less than 1.5 before we can consider doing endoscopies later on. Certainly, there is no urgency in doing the procedure.  CT scan of the abdomen was ordered. If it shows significant ascites, then either we need to increase the dose of diuretics or consider doing some therapeutic paracentesis or get rid of more fluid during hemodialysis. I will continue to follow the patient. If the patient continues to be anemic with active bleeding, let us know if and when the patient is stable to have those procedures done later on. Thank you for the referral.   ____________________________ Ezzard StandingPaul Y. Bluford Kaufmannh, MD pyo:sb D: 04/16/2014 14:33:00 ET T: 04/16/2014 15:07:01 ET JOB#: [id number]  cc: Ezzard StandingPaul Y. Bluford Kaufmannh, MD, <Dictator> Ezzard StandingPAUL Y OH MD ELECTRONICALLY SIGNED 04/17/2014 10:33

## 2015-02-20 NOTE — Consult Note (Signed)
PATIENT NAME:  Daniel Pennington, BIRKELAND MR#:  [id number] DATE OF BIRTH:  03/28/1932  DATE OF CONSULTATION:  03/02/2014  REFERRING PHYSICIAN:  Dr. Juliene Pina CONSULTING PHYSICIAN:  Jerolyn Center A. Kirke Corin, MD  REASON FOR CONSULTATION: Atrial fibrillation and PVCs.   HISTORY OF PRESENT ILLNESS: This is a 81 year old African American male with known history of type 2 diabetes, hypertension, chronic kidney disease, aortic valve replacement and CABG in 2004, on long-term anticoagulation, and irregular heart beats, according to the patient. The patient has been hospitalized since April 25th after he presented with lower extremity cellulitis. He also had significant lower extremity ischemia. He underwent angiogram and revascularization. He then developed acute on chronic renal failure and has been on dialysis. He was noted on telemetry to have episode of atrial fibrillation with controlled ventricular rate and frequent PVCs. The patient is overall asymptomatic and denies palpitations. He reports prolonged history of irregular heart beats, but not aware of specific arrhythmia. He denies any chest pain. He has chronic exertional dyspnea.   PAST MEDICAL HISTORY: 1.  Diabetes mellitus.  2.  Hypertension.  3.  Chronic kidney disease.  4.  Diastolic heart failure.  5.  Gastroesophageal reflux disease.  6.  History of aortic valve replacement and CABG in 2004.  7.  Prostate cancer.  8.  Rotator cuff surgery.   ALLERGIES: Include PENICILLIN.   MEDICATIONS: Reviewed in the [facility].   FAMILY HISTORY: Remarkable for hypertension and kidney disease.   REVIEW OF SYSTEMS: A 10 point review was performed. It is negative other than what is mentioned in the HPI.  PHYSICAL EXAMINATION: GENERAL: The patient appears to be at his stated age, in no acute distress.  VITAL SIGNS: Temperature 97.8, pulse 62, respiratory rate 18, blood pressure 108/60, and oxygen saturation is 92% on 4 liters nasal cannula.  HEENT: Normocephalic, atraumatic.   NECK: Jugular venous pressure is not well visualized.  RESPIRATORY: Normal respiratory effort with no use of accessory muscles. Auscultation reveals normal breath sounds.  CARDIOVASCULAR: Normal PMI. Normal S1 and S2 with no gallops. There is a 1/6 systolic ejection murmur at the aortic area.  ABDOMEN: Benign, nontender, and nondistended.  EXTREMITIES: +1 edema bilaterally with significant stasis dermatitis. The left leg is wrapped.  PSYCHIATRIC: The patient is alert and oriented x3 with normal mood and affect.   DIAGNOSTIC DATA: Creatinine is 3.49. He is anemic with hemoglobin of 8.6. INR is 2.9.   Telemetry shows episodes of atrial fibrillation with controlled ventricular rate and PVCs.   IMPRESSION: 1.  Atrial fibrillation with controlled ventricular rate. This could be a chronic issue for the patient.  2.  Premature ventricular complexes, likely due to electrolyte abnormalities in the setting of dialysis initiation.  3.  History of aortic valve replacement in 2004, on long-term anticoagulation.  4.  Chronic diastolic heart failure.  5.  Anemia with heme positive stool.   RECOMMENDATIONS: Telemetry was reviewed. The patient seems to be having episodes of atrial fibrillation. However, ventricular rate is controlled. He also has PVCs. He appears to be asymptomatic. He reports prolonged history of irregular heartbeats and it is possible that he has known history of atrial fibrillation. Given that his ventricular rate is controlled, I am not adding any medication for rate control. Continue long-term anticoagulation as he is already on warfarin. Echocardiogram showed normal LV systolic function with grade 2 diastolic dysfunction. The prosthetic aortic valve seems to be functioning reasonably well with a mean gradient of 24 mmHg.   ____________________________ Chelsea Aus. Kirke Corin,  MD maa:sb D: 03/02/2014 09:40:52 ET T: 03/02/2014 10:07:30 ET JOB#: [id number]  cc: Milagros Middendorf A. Kirke CorinArida, MD,  <Dictator> Iran OuchMUHAMMAD A Olen Eaves MD ELECTRONICALLY SIGNED 03/11/2014 22:42

## 2015-02-20 NOTE — Consult Note (Signed)
PATIENT NAME:  Effie ShyMILLS, Jon MR#:  [id number] DATE OF BIRTH:  08/05/1932  DATE OF CONSULTATION:  04/14/2014  REQUESTING PHYSICIAN:  Dr. Winona LegatoVaickute CONSULTING PHYSICIAN:  Stann Mainlandavid P. Sampson GoonFitzgerald, MD  REFERRING PHYSICIAN: Osteomyelitis.   HISTORY OF PRESENT ILLNESS: This is a 81 year old gentleman with a history of peripheral vascular disease as well as worsening renal function as well as left 5th metatarsal osteomyelitis status post amputation. He was originally seen by me on his prior admission in April and May when he had surgery on his left 5th metatarsal ray. Cultures at that time grew MRSA. On 04/26, he underwent revascularization and resection of the 5th ray. The patient was initially treated with vancomycin but had worsening renal function. He was then changed to daptomycin. He had a prolonged hospital course complicated by renal failure requiring dialysis. The patient was then discharged to a skilled nursing facility to receive IV antibiotics as well as followup with renal. He was supposed to have been discharged on daptomycin and oral Cipro and Flagyl. He was at the skilled nursing facility; however, at some point, he left on his own will. He called my office and we were attempting to arrange home health antibiotics; however, he was not homebound at the time. We also attempted to arrange antibiotics through the [facility] where he has been seen before. We have asked him to come for an initial dosing of daptomycin until this would be arranged as an outpatient; however, he did not show up. I then spoke to his daughter and he missed his appointment the followup day.   He presented now June 14th with anasarca and difficulty breathing and melena. He had not gone to dialysis since discharge. He was supratherapeutic on his warfarin. We were consulted for further antibiotic management.   PAST MEDICAL HISTORY:  1. Acute on chronic renal failure, now on dialysis.  2. Peripheral vascular disease.  3. Hypertension.   4. Congestive heart failure.  5. Urinary retention.  6. Hematuria.  7. Proximal atrial fibrillation.  8. Anemia of chronic disease.  9. Constipation.   PAST SURGICAL HISTORY:  1. Angioplasty to the left leg, left 5th toe metatarsal amputation.  2. History of prostate cancer surgery.  3. Left rotator cuff surgery.  4. Aortic valve replacement surgery.   ALLERGIES: HE IS ALLERGIC TO PENICILLIN.   SOCIAL HISTORY: Used to live with his family, but he is now largely bedbound.  He denies smoking or alcohol.   FAMILY HISTORY: Positive for hypertension and kidney disease.   REVIEW OF SYSTEMS:   Eleven systems reviewed and negative except as per history of present illness.   ANTIBIOTICS SINCE ADMISSION: Include oral ciprofloxacin, IV daptomycin, and oral metronidazole.   PHYSICAL EXAMINATION:  VITAL SIGNS: Temperature 97.9, pulse 60. Blood pressure 120/71, respirations 20, sat 98% on room air.  GENERAL: He is morbidly obese, lying in bed. He has received a dose of Phenergan for some nausea and he is relatively incoherent.  HEENT: Pupils equal, round, reactive to light and accommodation. Sclerae are muddy.  OROPHARYNX: Clear.  NECK: Supple.  HEART: Tachy but regular.  LUNGS: Coarse breath sounds bilaterally.  ABDOMEN: Obese, distended, anasarca.  EXTREMITIES: He has 2+ edema, bilateral lower extremities. His right leg is wrapped.  They report just some very dry skin. On his left leg, he has 2 shallow abrasions or ulcers on his shin intact. His left lateral foot has a healing incision site. There is no erythema or purulence. The foot is relatively cool.  There  is not a great pulse either. There is no odor or drainage.   LABORATORY DATA:  White blood count on admission was 4.9. His hemoglobin was quite low at 5.4, platelets 141,000. Troponins were positive at 0.6. Renal function is consistent with end-stage renal disease. Blood cultures x 2 are negative. Fecal occult blood testing is positive.   IMAGING DATA:  CT of the abdomen and pelvis without contrast shows abdominal ascites and moderate right pleural effusion. There is a bladder focus, likely hemorrhage. There is bilateral inguinal lymph nodes.  There is ankylosis of the thoracolumbar spine. Chest x-ray showed bilateral platelike areas of atelectasis, cardiac enlargement, and pulmonary vascular congestion.   Prior microbiology reviewed from his last hospitalization revealed MRSA from his foot wound. This was sensitive to gentamicin, vancomycin, linezolid, tigecycline, and Bactrim. Blood cultures at that time were also negative.   IMPRESSION: A 81 year old complicated gentleman with  end-stage renal disease, who has osteomyelitis of his left foot status post 5th ray amputation.  He was noncompliant with his outpatient IV daptomycin. He may or may not have been taking the ciprofloxacin and Flagyl.  At this point, the foot does not seem to be overwhelmingly infected. I do not think it is contributing to his current admission which is likely from missing dialysis.   RECOMMENDATIONS:  1. Continue daptomycin, Cipro,  and Flagyl.  2. If the foot continues to improve, we could consider switching him to oral treatment with Bactrim to cover MRSA as well as other gram negatives and continue Flagyl.  3. Thank you for the consult.  I will be glad to follow with you.    ____________________________ Stann Mainland. Sampson Goon, MD dpf:dd D: 04/14/2014 19:32:48 ET T: 04/14/2014 20:28:08 ET JOB#: [id number]  cc: Stann Mainland. Sampson Goon, MD, <Dictator> DAVID Sampson Goon MD ELECTRONICALLY SIGNED 04/23/2014 13:29

## 2015-02-20 NOTE — Consult Note (Signed)
PATIENT NAME:  Daniel Pennington, Krystian MR#:  [id number] DATE OF BIRTH:  1932-04-05  DATE OF CONSULTATION:  03/03/2014  REFERRING PHYSICIAN:  Dr. Juliene PinaMody.  CONSULTING PHYSICIAN:  Suszanne ConnersMichael R. Evelene CroonWolff, MD  REASON FOR CONSULTATION:  Urinary retention and hematuria.   HISTORY OF PRESENT ILLNESS:  Mr. Arvilla MarketMills is a 81 year old African American male with severe and multiple medical problems including renal failure, urinary retention, diabetic foot disease, osteomyelitis, heart failure, GERD, peripheral vascular disease, history of aortic valve replacement and history of coronary artery disease. Apparently, he developed urinary retention and a 16-French Foley catheter was placed earlier. He developed hematuria and the catheter was removed. The catheter was then replaced, and no urine output was observed, but bladder scan indicated possible large residual urine. The patient has known complaints regarding bladder at the present time.    ALLERGIES:  PENICILLIN.   CURRENT MEDICATIONS:  Include Percocet, amlodipine, aspirin, Colace, heparin, lisinopril, meropenem, Zofran, piperacillin, vancomycin, vitamin C, acetylcysteine, phytonadione, simethicone, Lasix, isosorbide mononitrate, Flomax, Coumadin, pantoprazole, Procrit, torsemide.   PREVIOUS SURGICAL PROCEDURES: Include mechanical aortic valve replacement, rotator cuff surgery, coronary bypass surgery, toe amputations.   PAST AND CURRENT MEDICAL CONDITIONS:  1.  Diabetes.  2.  Hypertension.  3.  Chronic renal failure, stage III.  4.  Congestive heart failure.  5.  GERD. 6.  History of prostate history.  7.  Aortic valve replacement.  8.  Anemia.   PHYSICAL EXAMINATION:  ABDOMEN:  Severely distended abdomen. Bladder not palpable. GENITOURINARY: Had edema of the foreskin with phimosis.  RECTAL: Deferred.   PERTINENT LABORATORY STUDIES: Include BUN of 40 and a creatinine of 2.94 today.  Renal ultrasound, dated April 30, indicated a right upper pole renal cyst.    IMPRESSION: 1.  Urinary retention.  2.  Hematuria due to exposure to anticoagulation and Foley catheter trauma.  3.  Questionable history of prostate cancer.  4.  End-stage renal disease.   PLAN:  1.  The 16-French Foley catheter was removed and a 26-French three-way catheter was placed. The catheter was irrigated until clear and CVI drainage was completely clear at the conclusion of his procedure.  2.  Avoid anticoagulation if possible.  3.  Remove Foley catheter as soon as possible to avoid further Foley trauma.  4.  The patient was instructed to follow up with his regular urologist for ongoing urological care as indicated.       ____________________________ Suszanne ConnersMichael R. Evelene CroonWolff, MD mrw:dmm D: 03/03/2014 18:45:18 ET T: 03/03/2014 20:54:37 ET JOB#: [id number]  cc: Suszanne ConnersMichael R. Evelene CroonWolff, MD, <Dictator> Orson ApeMICHAEL R Brigid Vandekamp MD ELECTRONICALLY SIGNED 03/04/2014 8:21

## 2015-02-20 NOTE — Consult Note (Signed)
Admit Diagnosis:   ACUTE OSTEOMYELITIS: Onset Date: 21-Feb-2014, Status: Active, Description: ACUTE OSTEOMYELITIS    Diabetes Mellitus, Type II (NIDD):    Prostate Cancer:    GERD:    HTN:    CHF:    Renal Disease:    Valve Replacement:    Rotator Cuff Surgery: left arm   bi pass:   Home Medications: Medication Instructions Status  ranitidine 150 mg oral tablet 1 tab(s) orally once a day Active  warfarin 5 mg oral tablet 1.5 tabs (7.5mg ) orally once a day on Sunday, Monday, Wednesday, Thursday, and Saturday. Take 2 tabs ( ) orally once a day on Tuesday and Friday. Active  acetaminophen-HYDROcodone 325 mg-5 mg oral tablet 1 tab(s) orally every 6 hours as needed for pain. Active  furosemide 40 mg oral tablet 1 tab(s) orally 2 times a day Active  Lubrifresh P.M. - ophthalmic ointment 1 application to each affected eye once a day (at bedtime) Active  lisinopril 20 mg oral tablet 2 tabs ( ) orally once a day Active  amLODIPine 10 mg oral tablet 1 tab(s) orally once a day Active   Lab Results:  Routine Chem:  26-Apr-15 07:11   Glucose, Serum  165  BUN  48  Creatinine (comp)  2.67  Routine Hem:  26-Apr-15 07:11   WBC (CBC) 6.2  Hemoglobin (CBC)  9.2  Hematocrit (CBC)  29.0   Radiology Results:  Radiology Results: XRay:    25-Apr-15 16:19, Foot Left AP and Lateral  Foot Left AP and Lateral  REASON FOR EXAM:    infection, ?osteomyelitis  COMMENTS:       PROCEDURE: DXR - DXR FOOT LEFT AP AND LATERAL  - Feb 21 2014  4:19PM     CLINICAL DATA:  Chronic wound.  Drainage.  Assess for osteomyelitis.    EXAM:  LEFT FOOT - 2 VIEW    COMPARISON:  None.    FINDINGS:  There is marked soft tissue swelling of the forefoot. There are  abnormal gas shadows within the soft tissues. The bones are  osteopenic. There is a high likelihood of osteomyelitis involving at  least the distal fifth and fourth metatarsals. There could be  involvement of the third and second.  Destructive changes are present  affecting the distal fifth metatarsal. Regional vascular  calcifications are present. There is pes planus.     IMPRESSION:  Osteomyelitis certainly involving the distal fifth metatarsal.  Likely osteomyelitis of the distal fourth metatarsal. Possible  involvement of the distal third and second metatarsals. Regional  soft tissue swelling. Air/ gas shadows in the soft tissues. .      Electronically Signed    By: Paulina Fusi M.D.    On: 02/21/2014 16:23         Verified By: Thomasenia Sales, M.D.,    Penicillin: Unknown  Nursing Flowsheets: **Vital Signs.:   26-Apr-15 05:30  Temperature Temperature (F) 97.9  Respirations Respirations 18    General Aspect Pt with left foot infection.   Present Illness Pt has been seen outpt.  Wound to left foot followed at [facility].  Last seen in my office approx. 3 weeks ago and had small wound vac type dressing in place.  States dressing changed at [facility] and was instructed to change himself which he is unable to perform.  Has had foul smelling drainage for several days. Admitted for osteomyelitis on xray.   Case History and Physical Exam:  Cardiovascular Non-palpalbe pulses to left foot.  Has seen vasc  surgery outpt and recommended lymph pump without needing angio.   Musculoskeletal Edema left foot   Neurological neuropathic.   Skin Pt with probing wound plantar left foot 5th mtpj.  Noted purulence with fluctuance around mtpj.    Impression Osteomyelitis with abscess left foot. DM Renal insuff. Hx of Aortic valve replacement   Plan Will need I & D with amputation left 5th and possibly 4th toe. NPO now.  Has not eaten since last night. D/W medicine and will order Vit K. Will consult vascular for non-palpable pulses with diabetic foot ulcer.   Electronic Signatures: Gwyneth RevelsFowler, Amariona Rathje (MD)  (Signed 26-Apr-15 10:02)  Authored: Health Issues, Significant Events - History, Home Medications, Labs, Radiology Results,  Allergies, Vital Signs, General Aspect/Present Illness, History and Physical Exam, Impression/Plan   Last Updated: 26-Apr-15 10:02 by Gwyneth RevelsFowler, Jermell Holeman (MD)

## 2015-02-20 NOTE — Consult Note (Signed)
Chief Complaint:  Subjective/Chief Complaint seen for GI bleeding.  denies abdominal pain or nausea, wants to eat more.   VITAL SIGNS/ANCILLARY NOTES: **Vital Signs.:   24-Jun-15 09:01  Telemetry pattern Cardiac Rhythm Atrial fibrillation; pattern reported by Telemetry Clerk; 68    14:17  Vital Signs Type Routine  Temperature Temperature (F) 98  Celsius 36.6  Pulse Pulse 70  Respirations Respirations 20  Systolic BP Systolic BP 93  Diastolic BP (mmHg) Diastolic BP (mmHg) 54  Mean BP 67  Pulse Ox % Pulse Ox % 94  Pulse Ox Activity Level  At rest  Oxygen Delivery Room Air/ 21 %   Brief Assessment:  Cardiac Irregular   Respiratory coarse wheezes.   Gastrointestinal details normal distended/protuberant. nontender, bowel sounds positive   Lab Results: Routine Chem:  24-Jun-15 05:14   Result Comment LABS - This specimen was collected through an   - indwelling catheter or arterial line.  - A minimum of 5mls of blood was wasted prior    - to collecting the sample.  Interpret  - results with caution.  Result(s) reported on 22 Apr 2014 at 05:58AM.  Routine Hem:  14-Jun-15 12:37   Hemoglobin (CBC)  5.4    13:28   Hemoglobin (CBC)  5.4  15-Jun-15 03:53   Hemoglobin (CBC)  6.6  16-Jun-15 07:37   Hemoglobin (CBC)  7.8    14:00   Hemoglobin (CBC)  7.9  17-Jun-15 03:38   Hemoglobin (CBC)  7.8  18-Jun-15 03:50   Hemoglobin (CBC)  7.4  19-Jun-15 04:33   Hemoglobin (CBC)  6.7    10:14   Hemoglobin (CBC)  7.4 (Result(s) reported on 17 Apr 2014 at 10:47AM.)    16:32   Hemoglobin (CBC)  6.9  20-Jun-15 01:52   Hemoglobin (CBC)  7.5    11:09   Hemoglobin (CBC)  7.4 (Result(s) reported on 18 Apr 2014 at 11:26AM.)    16:10   Hemoglobin (CBC)  7.3  21-Jun-15 04:49   Hemoglobin (CBC)  8.0  22-Jun-15 04:22   Hemoglobin (CBC)  8.2    20:15   Hemoglobin (CBC)  8.4  23-Jun-15 05:23   Hemoglobin (CBC)  7.7    17:54   Hemoglobin (CBC)  7.3    21:58   Hemoglobin (CBC)  7.4   24-Jun-15 05:14   WBC (CBC) 10.5  RBC (CBC)  2.71  Hemoglobin (CBC)  7.7  Hematocrit (CBC)  23.9  Platelet Count (CBC)  66  MCV 88  MCH 28.2  MCHC 32.1  RDW  18.4  Neutrophil % 86.5  Lymphocyte % 5.8  Monocyte % 6.4  Eosinophil % 0.6  Basophil % 0.7  Neutrophil #  9.1  Lymphocyte #  0.6  Monocyte # 0.7  Eosinophil # 0.1  Basophil # 0.1   Assessment/Plan:  Assessment/Plan:  Assessment 1) GI bleeding in the setting of coumadin anticoagulation and thrombocytopenia. Bleeding source of black stool was likely related to ulcer in hiatal hernia or AVM in the stomach.  This has not recurred for several days.  Current bleeding is bright red/clots and now some brown stool is noted.  The bleeding is likely to be hemorrhoidal in the setting of known h/o bleeding hemorrhoids that have been treated  in the past.   2) Multiple medical issues with artificial heart valve, AF, CKD, h/o prostate Ca, hematuria   Plan 1) awaiting CT with oral contrast of the abdomen.  2) If above is uninformative will do flex sig  following.  Electronic Signatures: Barnetta Chapel (MD)  (Signed 24-Jun-15 18:23)  Authored: Chief Complaint, VITAL SIGNS/ANCILLARY NOTES, Brief Assessment, Lab Results, Assessment/Plan   Last Updated: 24-Jun-15 18:23 by Barnetta Chapel (MD)

## 2015-02-20 NOTE — Consult Note (Signed)
Pt seen and examined. Full consult to follow. Pt with multiple medical problems with increasing abdominal girth/bloating, belching,episode of "dark" stools and significant anemia with drop in hgb from his normal chronic anemia. Pt on coumadin. Had EGD on previous admission. Small polyp in stomach that was not bleeding. Not removed due to patient being on coumadin. Colonoscopy could not be done due to poor prep.CT shows significant ascites. Can increase diuretics if ok with cardiology, or schedule paracentesis, or get more fluid during dialysis. Recommend daily PPI. If hgb keeps dropping OFF coumadin, will then repeat EGD while patient is here. Will follow. Thanks.  Electronic Signatures: Lutricia Feilh, Danie Hannig (MD)  (Signed on 15-Jun-15 09:44)  Authored  Last Updated: 15-Jun-15 09:44 by Lutricia Feilh, Erminio Nygard (MD)

## 2015-02-20 NOTE — Consult Note (Signed)
Chief Complaint:  Subjective/Chief Complaint seen for GI bleeding, melena in the setting of chronic anticoagulation. 2 black bm today.  denies nausea or abdominal pain.   VITAL SIGNS/ANCILLARY NOTES: **Vital Signs.:   22-Jun-15 08:00  Vital Signs Type Routine  Temperature Temperature (F) 98.9  Celsius 37.1  Temperature Source axillary  Pulse Pulse 67  Pulse source if not from Vital Sign Device per cardiac monitor  Respirations Respirations 22  Systolic BP Systolic BP 683  Diastolic BP (mmHg) Diastolic BP (mmHg) 84  Mean BP 98  BP Source  if not from Vital Sign Device non-invasive  Pulse Ox % Pulse Ox % 100  Pulse Ox Activity Level  At rest  Oxygen Delivery 2L; Nasal Cannula  Pulse Ox Heart Rate 60    09:00  Vital Signs Type Routine  Pulse Pulse 69  Pulse source if not from Vital Sign Device per cardiac monitor  Respirations Respirations 23  Systolic BP Systolic BP 419  Diastolic BP (mmHg) Diastolic BP (mmHg) 67  Mean BP 888  BP Source  if not from Vital Sign Device non-invasive  Pulse Ox % Pulse Ox % 99  Pulse Ox Activity Level  At rest  Oxygen Delivery 2L; Nasal Cannula  Pulse Ox Heart Rate 61    10:00  Vital Signs Type Routine  Pulse Pulse 63  Pulse source if not from Vital Sign Device per cardiac monitor  Respirations Respirations 19  Systolic BP Systolic BP 622  Diastolic BP (mmHg) Diastolic BP (mmHg) 76  Mean BP 92  BP Source  if not from Vital Sign Device non-invasive  Pulse Ox % Pulse Ox % 100  Pulse Ox Activity Level  At rest  Oxygen Delivery 2L; Nasal Cannula  Pulse Ox Heart Rate 44  *Intake and Output.:   22-Jun-15 03:54  Stool  Pt.had med very dark and tary bm.    10:42  Stool  pt had a large formed black bm.   Brief Assessment:  Cardiac Irregular   Respiratory clear BS   Gastrointestinal details normal Nontender  Bowel sounds normal  protuberant, non-tender   Lab Results: Hepatic:  22-Jun-15 04:22   Albumin, Serum  2.6  Routine BB:   22-Jun-15 07:40   Platelets (Blood Component) Ready (Result(s) reported on 20 Apr 2014 at 07:47AM.)  Routine Chem:  22-Jun-15 04:22   Result Comment LABS - This specimen was collected through an   - indwelling catheter or arterial line.  - A minimum of 42mls of blood was wasted prior    - to collecting the sample.  Interpret  - results with caution.  Result(s) reported on 20 Apr 2014 at 04:50AM.  Result Comment LABS - This specimen was collected through an   - indwelling catheter or arterial line.  - A minimum of 60mls of blood was wasted prior    - to collecting the sample.  Interpret  - results with caution.  Result(s) reported on 20 Apr 2014 at 04:50AM.  Glucose, Serum  129  BUN  42  Creatinine (comp)  2.86  Sodium, Serum 141  Potassium, Serum 4.1  Chloride, Serum 105  CO2, Serum 30  Calcium (Total), Serum 8.7  Phosphorus, Serum 2.7  Anion Gap  6  Osmolality (calc) 293  eGFR (African American)  23  eGFR (Non-African American)  20 (eGFR values <16mL/min/1.73 m2 may be an indication of chronic kidney disease (CKD). Calculated eGFR is useful in patients with stable renal function. The eGFR calculation will not be reliable  in acutely ill patients when serum creatinine is changing rapidly. It is not useful in  patients on dialysis. The eGFR calculation may not be applicable to patients at the low and high extremes of body sizes, pregnant women, and vegetarians.)    11:55   Result Comment LABS - This specimen was collected through an   - indwelling catheter or arterial line.  - A minimum of 51ms of blood was wasted prior    - to collecting the sample.  Interpret  - results with caution.  Result(s) reported on 20 Apr 2014 at 12:12PM.  Routine Coag:  22-Jun-15 04:22   Prothrombin  16.8  INR 1.4 (INR reference interval applies to patients on anticoagulant therapy. A single INR therapeutic range for coumarins is not optimal for all indications; however, the suggested range  for most indications is 2.0 - 3.0. Exceptions to the INR Reference Range may include: Prosthetic heart valves, acute myocardial infarction, prevention of myocardial infarction, and combinations of aspirin and anticoagulant. The need for a higher or lower target INR must be assessed individually. Reference: The Pharmacology and Management of the Vitamin K  antagonists: the seventh ACCP Conference on Antithrombotic and Thrombolytic Therapy. CIFOYD.7412Sept:126 (3suppl): [id number] A HCT value >55% may artifactually increase the PT.  In one study,  the increase was an average of 25%. Reference:  "Effect on Routine and Special Coagulation Testing Values of Citrate Anticoagulant Adjustment in Patients with High HCT Values." American Journal of Clinical Pathology 2006;126:400-405.)  Routine Hem:  14-Jun-15 12:37   Platelet Count (CBC)  141 (Result(s) reported on 12 Apr 2014 at 12:49PM.)  Hemoglobin (CBC)  5.4    13:28   Platelet Count (CBC)  144 (Result(s) reported on 12 Apr 2014 at 01:39PM.)  Hemoglobin (CBC)  5.4  15-Jun-15 03:53   Platelet Count (CBC)  135  Hemoglobin (CBC)  6.6  16-Jun-15 07:37   Platelet Count (CBC)  85  Hemoglobin (CBC)  7.8    14:00   Platelet Count (CBC)  91  Hemoglobin (CBC)  7.9  17-Jun-15 03:38   Platelet Count (CBC)  51  Hemoglobin (CBC)  7.8  18-Jun-15 03:50   Platelet Count (CBC)  41  Hemoglobin (CBC)  7.4  19-Jun-15 04:33   Platelet Count (CBC)  27  Hemoglobin (CBC)  6.7    10:14   Hemoglobin (CBC)  7.4 (Result(s) reported on 17 Apr 2014 at 10:47AM.)    16:32   Platelet Count (CBC)  32  Hemoglobin (CBC)  6.9  20-Jun-15 01:52   Platelet Count (CBC)  43  Hemoglobin (CBC)  7.5    11:09   Hemoglobin (CBC)  7.4 (Result(s) reported on 18 Apr 2014 at 11:26AM.)    16:10   Platelet Count (CBC)  43  Hemoglobin (CBC)  7.3  21-Jun-15 04:49   Platelet Count (CBC)  39  Hemoglobin (CBC)  8.0  22-Jun-15 04:22   Platelet Count (CBC)  84  WBC (CBC) 6.6   RBC (CBC)  3.00  Hemoglobin (CBC)  8.2  Hematocrit (CBC)  26.3  MCV 88  MCH 27.4  MCHC  31.2  RDW  17.9  Neutrophil % 73.0  Lymphocyte % 13.4  Monocyte % 11.2  Eosinophil % 1.6  Basophil % 0.8  Neutrophil # 4.8  Lymphocyte #  0.9  Monocyte # 0.7  Eosinophil # 0.1  Basophil # 0.1 (Result(s) reported on 20 Apr 2014 at 04:50AM.)    11:55   Platelet Count (CBC)  92  Radiology Results: Nuclear Med:    19-Jun-15 19:34, GI Blood Loss Study - Nuc Med  GI Blood Loss Study - Nuc Med   REASON FOR EXAM:    gI bleed, melena  COMMENTS:       PROCEDURE: NM  - NM GI BLOOD LOSS STUDY  - Apr 17 2014  7:34PM     CLINICAL DATA:  GI bleed, melena    EXAM:  NUCLEAR MEDICINE GASTROINTESTINAL BLEEDING SCAN    TECHNIQUE:  Sequential abdominal images were obtained following intravenous  administration of Tc-81mlabeled red blood cells.    RADIOPHARMACEUTICALS:  20.7 mCi Tc-939mn-vitro labeled autologous  red cells.    COMPARISON:  None; correlation CT abdomen and pelvis 04/12/2014    FINDINGS:  Motion artifacts during first 5 min.    Beginning early in the study, linear tracer accumulation is  identified at the expected position of the gastric antrum suggesting  a gastric source of active GI bleeding.    Several additional focal areas ofabnormal tracer accumulation  project over the stomach during the remainder of the first hour of  imaging.  Large photopenic defect surrounds bowel in the abdomen corresponding  to large volume ascites as noted on CT.    Bowel loops are clumped centrally in the mid abdomen.    No other abnormal sites of gastrointestinal tracer localization are  identified.    No de labeled tracer within urinary bladder is evident during the  exam.     IMPRESSION:  Abnormal exam demonstrating early tracer accumulation at the  expected position of the gastric antrum suspicious for a gastric  source of GI bleeding.  Significant  ascites.      Electronically Signed    By: MaLavonia Dana.D.    On: 04/17/2014 19:51         Verified By: MABurnetta SabinM.D.,   Assessment/Plan:  Assessment/Plan:  Assessment 1) melena, positive bleeding scan 3 d ago, stable hemodynamically, 2 black stools today.   2) multiple medical issues with artificial heart valve, AF, hematuria, esrd, toe amputation, prostate cancer, left rotator repair.   Plan 1) egd today.  I have discussed the risks benefits and complicatuions of egd to include not limited to bleeding infection perforation and sedation and he wishes to proceed.   Electronic Signatures: SkLoistine SimasMD)  (Signed 22-Jun-15 14:15)  Authored: Chief Complaint, VITAL SIGNS/ANCILLARY NOTES, Brief Assessment, Lab Results, Radiology Results, Assessment/Plan   Last Updated: 22-Jun-15 14:15 by SkLoistine SimasMD)

## 2015-02-20 NOTE — Discharge Summary (Signed)
PATIENT NAME:  Daniel Pennington, ESCUE MR#:  [id number] DATE OF BIRTH:  [birth year]  DATE OF ADMISSION:  04/12/2014 DATE OF DISCHARGE:  04/27/2014  Please refer to the interim summary already dictated on 04/19/2014.  ADMISSION DIAGNOSIS: 1.  Acute and chronic anemia.   DISCHARGE DIAGNOSES: 1.  Acute on chronic posthemorrhagic anemia secondary to gastrointestinal bleed from arteriovenous malformations, as well as nonbleeding esophageal ulcer, gastric polyps, gastric ulcer and hemorrhoidal bleed.  2.  Left foot osteomyelitis.  3.  Coagulopathy due to Coumadin.  4.  Hematuria.  5.  Abdominal distention due to ascites.  6.  End-stage renal disease on hemodialysis.  7.  Mechanical aortic valve.  8.  History of atrial fibrillation.   CONSULTATIONS: 1.  Dr. Donnald Garre from palliative care.  2.  Dr. Lonna Cobb from urology.  3.  Dr. Doylene Canning.  4.  Dr. Cherylann Ratel from nephrology.  5.  Dr. Marva Panda from GI.  6.  Dr. Sampson Goon from infectious disease.   Pertinent endoscopy, 04/20/2014, the patient had a nonbleeding esophageal ulcer, hiatal hernia, nonobstructing gastric oxygen with clean base, multiple gastric polyps and a single nonbleeding, but very superficial angiectasia in the stomach treated by fulguration.   HOSPITAL COURSE:  A 81 year old male who presented on June 14th with difficulty breathing, abdominal distention and melena. For further details, please refer to the H and P. Again, please refer to the interim summary dictated on 04/19/2014, by Dr. Winona Legato.  1.  Acute blood loss anemia on chronic disease. The patient underwent an EGD, which showed esophageal and gastric ulcer with gastric AVMs as per the EGD. He was on PPI and also should be on Carafate. His hemoglobin has remained stable.  A CT of the abdomen was performed, which showed resolved bleeding around the bladder. He had hemorrhoidal bleed and did need blood transfusion, but his hemoglobin has remained stable and, as per GI, no plans for  sigmoidoscopy.  2.  Coagulopathy, initially due to Coumadin. He had 3 units of FFP and vitamin K.  He has a  mechanical aortic valve. Due to ongoing bleeding, he is not a candidate for anticoagulation as per Dr. Marva Panda at this time, due to the friability lesions the lesions and GI bleed.  3.  Hematuria. The patient did have hematuria. His CT did show a blood clot, which has resolved. Urology has seen the patient. At this time, no further workup.  4.  Abdominal distention due to ascites, improved with hemodialysis.  5.  Left foot osteomyelitis. 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 He was seen ID, podiatry and vascular surgery. He was on daptomycin, ciprofloxacin and Flagyl. He will be discharged with Flagyl and Bactrim for 2 more weeks. He will need podiatry and vascular followup, as well as wound care.  6.  Bilateral pneumonia. The patient was treated for this.  7.  End-stage renal disease on hemodialysis. The patient is now on hemodialysis schedule of  Tuesday, Thursday and Saturday 8.  Mechanical aortic valve. He is off of Coumadin due to the GI bleed and needs very close monitoring. Appreciate cardiology consultation.  9.  History of atrial fibrillation. His rate is controlled.  10. 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 No heparin products.    DISCHARGE MEDICATIONS: 1.  Carafate 1 gram q.i.d.  2.  Flagyl 500 mg q.12 hours x 13 days.  3.  Sodium bicarb 325 mg b.i.d.  4.  Iron polysaccharide 150 mg p.o. t.i.d.  5.  Richardean Chimera  1 to each affected eye daily.  6.  Bactrim double strength 1 tablet daily for 13 days.  7.  Protonix 40 mg p.o. b.i.d.   DISCHARGE DIET: Renal diet, low sodium.   DISCHARGE ACTIVITY: As tolerated.   DISCHARGE REFERRAL: Physical therapy.   The patient is stable for discharge. The patient will be discharged once insurance approval and patient has a bed at skilled nursing facility.    TIME 40  minutes  ____________________________ Sital P. Juliene PinaMody, MD spm:dmm D: 04/27/2014 12:21:10 ET T: 04/27/2014 12:51:48 ET JOB#: [id number]  cc: Sital P. Juliene PinaMody, MD, <Dictator> Janyth ContesSITAL P MODY MD ELECTRONICALLY SIGNED 04/28/2014 12:25

## 2015-02-20 NOTE — Op Note (Signed)
PATIENT NAME:  Daniel Pennington, Sanad MR#:  [id number] DATE OF BIRTH:  01-May-1932  DATE OF PROCEDURE:  03/04/2014   PREOPERATIVE DIAGNOSES: 1.  End-stage renal disease.  2.  Peripheral arterial disease with ulceration and infection to the lower extremity.  3.  Hypertension.   POSTOPERATIVE DIAGNOSES: 1.  End-stage renal disease.  2.  Peripheral arterial disease with ulceration and infection to the lower extremities.  3.  Hypertension.    PROCEDURES: 1.  Ultrasound guidance for vascular access to right internal jugular vein.  2.  Fluoroscopic guidance for placement of catheter.  3. Placement of a 19 cm tip-to-cuff tunneled hemodialysis catheter via the right internal jugular vein.   SURGEON:  Festus BarrenJason Amante Fomby, M.D.  ANESTHESIA:  Local with sedation.   BLOOD LOSS:  25 mL.   INDICATION FOR PROCEDURE:  A 81 year old gentleman who was admitted with acute on chronic renal failure. He had a temporary catheter placed several days ago when he traversed to dialysis. He had an angiogram performed for ulceration and infection of lower extremity in hopes of limb salvage. He has now progressed to end-stage renal disease needs a PermCath.   DESCRIPTION OF THE PROCEDURE: The patient was brought to the vascular and interventional radiology suite. The patient's right neck and chest were sterilely prepped and draped and a sterile surgical field was created. The right internal jugular vein was visualized with ultrasound and found to be patent. It was then accessed under direct ultrasound guidance and a permanent image was recorded. A wire was placed. After a skin nick and dilatation, the peel-away sheath was placed over the wire.   I then turned my attention to an area under the clavicle. Approximately 2 fingerbreadths below the clavicle a small counter incision was created and we tunneled from the subclavicular incision to the access site. Using fluoroscopic guidance, a 19 cm tip-to-cuff tunneled hemodialysis catheter was  selected, tunneled from the subclavicular incision to the access site. It was then placed through the peel-away sheath and the peel-away sheath was removed. The catheter tips were parked in the right atrium. The appropriate distal connectors were placed. It withdrew blood well and flushed easily with heparinized saline and a concentrated heparin solution was then placed. It was secured to the chest wall with 2 Prolene sutures. The access incision was closed with a single 4-0 Monocryl. A 4-0 Monocryl pursestring suture was placed around the exit site. Sterile dressings were placed.   The patient tolerated the procedure well and was taken to the recovery room in stable condition.    ____________________________ Annice NeedyJason S. Neve Branscomb, MD jsd:dmm D: 03/04/2014 11:02:07 ET T: 03/04/2014 13:24:56 ET JOB#: [id number]  cc: Annice NeedyJason S. Maleeah Crossman, MD, <Dictator> Annice NeedyJASON S Motty Borin MD ELECTRONICALLY SIGNED 03/05/2014 14:26

## 2015-02-20 NOTE — Consult Note (Signed)
PATIENT NAME:  Daniel Pennington, Daniel Pennington MR#:  [id number] DATE OF BIRTH:  1932-04-24  DATE OF ADMISSION: 02/21/2014  DATE OF CONSULTATION:  03/01/2014  CONSULTING SERVICE:  Gastroenterology  CONSULTING PHYSICIAN:  Midge Miniumarren Brigitte Soderberg, MD  REASON FOR CONSULTATION: Heme positive stools, on anticoagulation.   HISTORY OF PRESENT ILLNESS: This patient is a [age] year old gentleman who I am seeing today because of Heme positive stools. The patient has a history of aortic valve replacement, and has been found to have a hemoglobin drop from 8.5 yesterday to 7.7 today, with heme positive stools. The patient's baseline is right around 8 during this admission. The patient was admitted for osteomyelitis. He denies any abdominal pain, fevers, chills, nausea or vomiting. The patient denies having seen any black stools or bloody stools. He also denies having a colonoscopy for many years, and thinks it was done about 10 years ago at the [facility].   PAST MEDICAL HISTORY: Diabetes, peripheral vascular disease, foot ulcerations, hypertension, chronic kidney disease, congestive heart failure, GERD, prostate cancer, aortic valve replacement, rotator cuff surgery, CABG.   SOCIAL HISTORY:  The patient denies alcohol, drug or smoking.   FAMILY HISTORY:  Negative for GI problems.   ALLERGIES: PENICILLIN.  REVIEW OF SYSTEMS:  A 10-point review of systems is negative, except what was stated in the HPI.  PHYSICAL EXAMINATION: GENERAL: The patient is sitting in bed in no apparent distress, speaking in full sentences.  VITAL SIGNS: Temperature 97.8, pulse 54, respirations 18, blood pressure 123/69, pulse oximetry 98%.  HEENT: Normocephalic, atraumatic. Extraocular motor intact. Pupils equally round and reactive to light and accommodation, without JVD, without lymphadenopathy.  LUNGS: Clear to auscultation bilaterally.  HEART: With metallic heart sounds from mechanical valve.  ABDOMEN: Soft, nontender, nondistended, without hepatosplenomegaly.   EXTREMITIES: Without cyanosis, clubbing, or edema. Has a graft postoperatively.  NEUROLOGICAL EXAM: Grossly intact.  SKIN: Without any rashes or lesions.   ANCILLARY SERVICES: Hemoglobin as stated above, MCV normal at 85.   ASSESSMENT AND PLAN: This patient is a [age] year old gentleman who has been found to have heme positive stools on a blood thinner. The patient needs a blood thinner for his aortic valve replacement. The patient will be set up for an EGD and colonoscopy to evaluate source for his bleeding. The patient has an elevated INR, and will not have biopsies done if the INR is still elevated tomorrow. If it does come down, then any findings that are found will be biopsied and removed.   Thank you very much for involving me in the care of this patient. If you have any questions, please do not hesitate to call.     ____________________________ Midge Miniumarren Boleslaus Holloway, MD dw:mr D: 03/01/2014 12:29:42 ET T: 03/01/2014 17:04:11 ET JOB#: [id number]  cc: Midge Miniumarren Ellina Sivertsen, MD, <Dictator> Midge MiniumARREN Mimie Goering MD ELECTRONICALLY SIGNED 03/02/2014 7:10

## 2015-02-20 NOTE — Consult Note (Signed)
Chief Complaint:  Subjective/Chief Complaint seen for melena.  no recurrent bleeding.  no abdominal pain or nausea, tolerating full liquids.   VITAL SIGNS/ANCILLARY NOTES: **Vital Signs.:   26-Jun-15 07:06  Vital Signs Type Routine  Temperature Temperature (F) 98.6  Celsius 37  Temperature Source oral  Pulse Pulse 70  Pulse source if not from Vital Sign Device per Telemetry Clerk  Respirations Respirations 20  Systolic BP Systolic BP 104  Diastolic BP (mmHg) Diastolic BP (mmHg) 60  Mean BP 74  Pulse Ox % Pulse Ox % 92  Pulse Ox Activity Level  At rest  Oxygen Delivery Room Air/ 21 %  Telemetry pattern Cardiac Rhythm Atrial fibrillation; pattern reported by Telemetry Clerk; 70   Brief Assessment:  Cardiac Irregular   Respiratory occasional coarse rhonchi   Gastrointestinal details normal protuberant, non-tneder, bowel sounds positive   Lab Results: Routine Chem:  26-Jun-15 10:04   Phosphorus, Serum 2.8 (Result(s) reported on 24 Apr 2014 at 10:55AM.)  Routine Hem:  14-Jun-15 12:37   Hemoglobin (CBC)  5.4    13:28   Hemoglobin (CBC)  5.4  15-Jun-15 03:53   Hemoglobin (CBC)  6.6  16-Jun-15 07:37   Hemoglobin (CBC)  7.8    14:00   Hemoglobin (CBC)  7.9  17-Jun-15 03:38   Hemoglobin (CBC)  7.8  18-Jun-15 03:50   Hemoglobin (CBC)  7.4  19-Jun-15 04:33   Hemoglobin (CBC)  6.7    10:14   Hemoglobin (CBC)  7.4 (Result(s) reported on 17 Apr 2014 at 10:47AM.)    16:32   Hemoglobin (CBC)  6.9  20-Jun-15 01:52   Hemoglobin (CBC)  7.5    11:09   Hemoglobin (CBC)  7.4 (Result(s) reported on 18 Apr 2014 at 11:26AM.)    16:10   Hemoglobin (CBC)  7.3  21-Jun-15 04:49   Hemoglobin (CBC)  8.0  22-Jun-15 04:22   Hemoglobin (CBC)  8.2    20:15   Hemoglobin (CBC)  8.4  23-Jun-15 05:23   Hemoglobin (CBC)  7.7    17:54   Hemoglobin (CBC)  7.3    21:58   Hemoglobin (CBC)  7.4  24-Jun-15 05:14   Hemoglobin (CBC)  7.7  25-Jun-15 06:45   Hemoglobin (CBC)  7.6  26-Jun-15  06:17   WBC (CBC) 5.1  RBC (CBC)  2.76  Hemoglobin (CBC)  7.8  Hematocrit (CBC)  24.0  Platelet Count (CBC)  86  MCV 87  MCH 28.1  MCHC 32.3  RDW  19.2  Neutrophil % 69.5  Lymphocyte % 17.8  Monocyte % 11.8  Eosinophil % 0.6  Basophil % 0.3  Neutrophil # 3.6  Lymphocyte #  0.9  Monocyte # 0.6  Eosinophil # 0.0  Basophil # 0.0 (Result(s) reported on 24 Apr 2014 at 06:50AM.)   Radiology Results: CT:    25-Jun-15 01:50, CT Abdomen and Pelvis Without Contrast  CT Abdomen and Pelvis Without Contrast   REASON FOR EXAM:    (1) Abd distenstion, gi bleed; (2) Abd distenstion,   gi bleed;    NOTE: Nursing t  COMMENTS:       PROCEDURE: CT  - CT ABDOMEN AND PELVIS W0  - Apr 23 2014  1:50AM     CLINICAL DATA:  Gastrointestinal bleeding. Multiple medical issues  including prostate cancer due, diabetes, hypertension, diastolic  heart failure. Abdominal distention.    EXAM:  CT ABDOMEN AND PELVIS WITHOUT CONTRAST    TECHNIQUE:  Multidetector CT imaging of the abdomen and pelvis was  performed  following the standard protocol without IV contrast.    COMPARISON:  04/12/2014    FINDINGS:  Bilateral pleural effusions with bibasilar atelectasis. Contrast  medium in the distal esophagus compatible with gastroesophageal  reflux.    Low-density blood pool. Cardiomegaly. Subcutaneous edema. Prominent  ascites.    The noncontrast CT appearance of the liver, spleen, pancreas, and  adrenal glands is within normal limits. Small gallstone noted in the  gallbladder.  Atrophic kidneys, no renal calculi or hydronephrosis.    Aortoiliac atherosclerotic vascular disease. No pathologic upper  abdominal adenopathy is observed. Orally administered contrast  extends through to the rectum.    Small inguinal lymph nodes are not pathologically enlarged by size  criteria. Urinary bladder unremarkable. Ill definition of the  prostate bed. Prominent lumbar and thoracic spondylosis with  multilevel  interbody bridging spurring.     IMPRESSION:  1. The hyperdense focus in the urinary bladder has resolved.  Accordingly this was likely blood products.  2. Diffuse subcutaneous edema with extensive ascites and small  bilateral pleural effusions, compatible with third spacing of fluid.  3. Cardiomegaly.  Low-density blood pool favors anemia.  4. Small gallstone.  5. Atherosclerosis.  6. Stable prominent lumbar and thoracic spondylosis.  7. Gastroesophageal reflux.      Electronically Signed    By: Herbie Baltimore M.D.    On: 04/23/2014 01:58         Verified By: Dellia Cloud, M.D.,   Assessment/Plan:  Assessment/Plan:  Assessment 1) GI bleeding/melena in the setting of coumadin anticoagulation.  Now off anticoagulation.  EGD showing gastric ulcer with stigmata of bleeding and AVM treated. HGB stable. Stool reported as brown today.  Tolerating full liquids.  2) rectal bleeding/hematochezia-likely from anal outlet/hemorrhoidal in the setting of hemorrhoidal-not recurrent for 2 days.  3) Multiple medical issues with artificial heart valve, AF, CKD, h/o prostate Ca, hematuria   Plan 1)  continue current.  advance diet to low residue/soft tomorrow if continues to show no bleeding.   Dr Mechele Collin covering over the weekend.   Electronic Signatures: Barnetta Chapel (MD)  (Signed 26-Jun-15 16:09)  Authored: Chief Complaint, VITAL SIGNS/ANCILLARY NOTES, Brief Assessment, Lab Results, Radiology Results, Assessment/Plan   Last Updated: 26-Jun-15 16:09 by Barnetta Chapel (MD)

## 2015-02-20 NOTE — Op Note (Signed)
PATIENT NAME:  Daniel Pennington, Daniel Pennington MR#:  [id number] DATE OF BIRTH:  [birth year]  DATE OF PROCEDURE:  02/22/2014  PREOPERATIVE DIAGNOSIS: Left fifth metatarsophalangeal joint osteomyelitis.   POSTOPERATIVE DIAGNOSIS: Left fifth metatarsophalangeal joint osteomyelitis.   PROCEDURE: Amputation of left fifth ray with implantation of vancomycin and tobramycin beads.    ANESTHESIA: General.   HEMOSTASIS: None.   COMPLICATIONS: None.   SPECIMEN: Infected fifth ray.  ESTIMATED BLOOD LOSS: 50 mL.  OPERATIVE INDICATIONS: This is a 81 year old gentleman who has developed osteomyelitis in his fifth metatarsophalangeal joint. He had fluctuance around his fifth MTPJ, and I felt appropriate to take this the OR, drain the abscess to remove the infected bone. The risks, benefits, alternatives, and complications associated with surgery were discussed with the patient and full informed consent has been given.   OPERATIVE PROCEDURE: The patient was brought into the OR and placed on the operating table in the supine position. General intubation was administered by the anesthesia team. The left lower extremity was then prepped and draped in the usual sterile fashion. Attention was directed to the lateral aspect of the foot where a longitudinal incision was made from the MTPJ to the mid tarsal joint region. Sharp and blunt dissection was carried down to the deeper layers. There was noted to be a fairly noted purulent abscess on the dorsal fifth MTPJ. This was flushed. He had noted bleeding to the area and this was cauterized with Bovie cautery. Next, the fifth metatarsal was removed including the toe to approximately mid shaft along the fifth metatarsal region. Further bleeding was Bovie cauterized. The wound was flushed with 3 liters of normal saline with a pulsed lavage. At this time, the wound was then infiltrated with SURGIFLO for coagulation of the bleeding. This was then flushed away. It was packed with tobramycin and  gentamicin-impregnated calcium sulfate beads, Stimulan type. The wound was then closed with 3-0 nylon dorsally along the incision. A portion of the plantar ulcer had been debrided away and was partially closed, allowing a small area for drainage. A bulky sterile dressing was placed around the entire forefoot region. Overall he tolerated the procedure and anesthesia well and was transported from the OR to the PACU with all vital signs stable and vascular status intact. I will see him in the outpatient clinic upon discharge. He will be readmitted to the floor with a possible wound VAC to be placed in the near future.     ____________________________ Argentina DonovanJustin A. Ether GriffinsFowler, DPM jaf:lt D: 02/22/2014 11:49:03 ET T: 02/22/2014 21:54:42 ET JOB#: [id number]  cc: Jill AlexandersJustin A. Ether GriffinsFowler, DPM, <Dictator> Melvia Matousek DPM ELECTRONICALLY SIGNED 03/09/2014 9:41

## 2015-02-20 NOTE — Consult Note (Signed)
VITAL SIGNS/ANCILLARY NOTES: **Vital Signs.:   06-May-15 00:14  Systolic BP Systolic BP 119   Assessment/Plan:  Assessment/Plan:  Assessment Per Dr Servando SnareWohl pt refused any further inpatient GI workup. Will sign off.  Please call if you have any questions or concerns   Electronic Signatures: Joselyn ArrowJones, Rosi Secrist L (NP)  (Signed 06-May-15 12:34)  Authored: VITAL SIGNS/ANCILLARY NOTES, Assessment/Plan   Last Updated: 06-May-15 12:34 by Joselyn ArrowJones, Judah Chevere L (NP)

## 2015-02-20 NOTE — Consult Note (Signed)
Chief Complaint:  Subjective/Chief Complaint seen for recurrent GI bleeding/melena.  one small black stool since yesterday.  was sedated due to combativeness/agitation this am.  no emesis or abdominal pain.   VITAL SIGNS/ANCILLARY NOTES: **Vital Signs.:   21-Jun-15 10:45  Vital Signs Type Routine  Temperature Temperature (F) 98.8  Celsius 37.1  Temperature Source oral  Pulse Pulse 65  Pulse source if not from Vital Sign Device per cardiac monitor  Respirations Respirations 18  Systolic BP Systolic BP 134  Diastolic BP (mmHg) Diastolic BP (mmHg) 63  Mean BP 86  BP Source  if not from Vital Sign Device non-invasive  Pulse Ox % Pulse Ox % 98  Pulse Ox Activity Level  At rest  Oxygen Delivery 2L  *Intake and Output.:   21-Jun-15 03:24  Stool  Pt. had small bm   Brief Assessment:  Cardiac Irregular   Respiratory clear BS   Gastrointestinal details normal protuberant/moderate distension, positive bowel sounds   Lab Results: Routine BB:  21-Jun-15 07:58   Platelets (Blood Component) Issued (Result(s) reported on 19 Apr 2014 at 11:13AM.)  Routine Chem:  21-Jun-15 04:49   Result Comment LABS - This specimen was collected through an   - indwelling catheter or arterial line.  - A minimum of of blood was wasted prior    - to collecting the sample.  Interpret  - results with caution.  Result(s) reported on 19 Apr 2014 at 05:15AM.  Result Comment LABS - This specimen was collected through an   - indwelling catheter or arterial line.  - A minimum of of blood was wasted prior    - to collecting the sample.  Interpret  - results with caution.  Result(s) reported on 19 Apr 2014 at 05:07AM.  Magnesium, Serum 1.8 (1.8-2.4 THERAPEUTIC RANGE: 4-7 mg/dL TOXIC: > 10 mg/dL  -----------------------)  Glucose, Serum  125  BUN  36  Creatinine (comp)  2.32  Sodium, Serum 143  Potassium, Serum 3.9  Chloride, Serum 105  CO2, Serum 31  Calcium (Total), Serum 8.5  Anion Gap 7   Osmolality (calc) 295  eGFR (African American)  30  eGFR (Non-African American)  26 (eGFR values <27mL/min/1.73 m2 may be an indication of chronic kidney disease (CKD). Calculated eGFR is useful in patients with stable renal function. The eGFR calculation will not be reliable in acutely ill patients when serum creatinine is changing rapidly. It is not useful in  patients on dialysis. The eGFR calculation may not be applicable to patients at the low and high extremes of body sizes, pregnant women, and vegetarians.)  Routine Coag:  20-Jun-15 16:10   INR 1.5 (INR reference interval applies to patients on anticoagulant therapy. A single INR therapeutic range for coumarins is not optimal for all indications; however, the suggested range for most indications is 2.0 - 3.0. Exceptions to the INR Reference Range may include: Prosthetic heart valves, acute myocardial infarction, prevention of myocardial infarction, and combinations of aspirin and anticoagulant. The need for a higher or lower target INR must be assessed individually. Reference: The Pharmacology and Management of the Vitamin K  antagonists: the seventh ACCP Conference on Antithrombotic and Thrombolytic Therapy. Chest.2004 Sept:126 (3suppl): L7870634. A HCT value >55% may artifactually increase the PT.  In one study,  the increase was an average of 25%. Reference:  "Effect on Routine and Special Coagulation Testing Values of Citrate Anticoagulant Adjustment in Patients with High HCT Values." American Journal of Clinical Pathology 2006;126:400-405.)  Routine Hem:  14-Jun-15 12:37   Hemoglobin (CBC)  5.4  Platelet Count (CBC)  141 (Result(s) reported on 12 Apr 2014 at 12:49PM.)    13:28   Hemoglobin (CBC)  5.4  Platelet Count (CBC)  144 (Result(s) reported on 12 Apr 2014 at 01:39PM.)  15-Jun-15 03:53   Hemoglobin (CBC)  6.6  Platelet Count (CBC)  135  16-Jun-15 07:37   Hemoglobin (CBC)  7.8  Platelet Count (CBC)  85     14:00   Hemoglobin (CBC)  7.9  Platelet Count (CBC)  91  17-Jun-15 03:38   Hemoglobin (CBC)  7.8  Platelet Count (CBC)  51  18-Jun-15 03:50   Hemoglobin (CBC)  7.4  Platelet Count (CBC)  41  19-Jun-15 04:33   Hemoglobin (CBC)  6.7  Platelet Count (CBC)  27    10:14   Hemoglobin (CBC)  7.4 (Result(s) reported on 17 Apr 2014 at 10:47AM.)    16:32   Hemoglobin (CBC)  6.9  Platelet Count (CBC)  32  20-Jun-15 01:52   Hemoglobin (CBC)  7.5  Platelet Count (CBC)  43    11:09   Hemoglobin (CBC)  7.4 (Result(s) reported on 18 Apr 2014 at 11:26AM.)    16:10   Hemoglobin (CBC)  7.3  Platelet Count (CBC)  43  21-Jun-15 04:49   WBC (CBC) 6.9  RBC (CBC)  2.83  Hemoglobin (CBC)  8.0  Hematocrit (CBC)  24.5  Platelet Count (CBC)  39  MCV 86  MCH 28.2  MCHC 32.6  RDW  17.6  Neutrophil % 72.8  Lymphocyte % 14.7  Monocyte % 10.9  Eosinophil % 1.2  Basophil % 0.4  Neutrophil # 5.0  Lymphocyte # 1.0  Monocyte # 0.8  Eosinophil # 0.1  Basophil # 0.0   Assessment/Plan:  Assessment/Plan:  Assessment 1) recurrent melena.  recent egd showing gastric polyp but no stigmata of bleeding. no repeat bleeding since late last night.  hemodynamically stable. This is complicated by thrombocytopenia, consumptive versus hepatin induced.  2) history of mechanical aortic valve replacement over 10 years ago, on chronic coumadin anticoagulation-held due to clinical situation.  3) multiple medical issues with hematuria, ESRD, atrial fibrillation,. osteomyelitis, htn, diastolic chf   Plan 1) agree with plt tfx today, recommend another unit to be given before egd tomorrow.  INR should be less than 1.5 and plt above 65.  Patient was unable to give consent today due to sedation, will discuss with himn tomorrow.   Electronic Signatures: Loistine Simas (MD)  (Signed 21-Jun-15 13:43)  Authored: Chief Complaint, VITAL SIGNS/ANCILLARY NOTES, Brief Assessment, Lab Results, Assessment/Plan   Last Updated:  21-Jun-15 13:43 by Loistine Simas (MD)

## 2015-02-20 NOTE — Consult Note (Signed)
PATIENT NAME:  Daniel Pennington, Daniel Pennington MR#:  [id number] DATE OF BIRTH:  23-Jun-1932  DATE OF CONSULTATION:  04/13/2014  CONSULTING PHYSICIAN:  Rhona RaiderMatthew G. Phillipa Morden, DPM  REASON FOR CONSULTATION:  Ulcer, diabetic, plantar left foot, status post fifth ray amputation done by Dr. Ether GriffinsFowler.  HISTORY OF PRESENT ILLNESS:  Apparently, he was in a rehab facility and was discharged from there with the intent to have antibiotics given at home. I think he was on daptomycin, but apparently the home care nurse associated with this told him she was not supposed to do that and signed off and he never received his last 9 to 10 doses of the daptomycin. He says his foot has been a little sore and tender, still has drainage and opening on the plantar aspect of the left foot. He also has a history of stage IV chronic kidney disease. He has had some abdominal distention as of late as well. He has also had a venous stasis-type wound on his left lower leg and uncertain as to exactly when he had the left foot surgery, but it is probably about 3 weeks ago. Actually, it may have been long than that; that date is uncertain according to his history. He has had some significant anemia. He has had 2 transfusions to bring up to his hemoglobin, which is more stable now.   PAST MEDICAL HISTORY: Includes chronic renal insufficiency progressing to hemodialysis on his last admission, left fifth metatarsal osteomyelitis. He is status post status post amputation with that. He looks like he had a left tibial artery angioplasty. He has had mechanical aortic valve replacements, hypertension, diastolic CHF, urinary retention, hematuria, proximal afib, abdominal distention, anemia of chronic disease, hematuria and constipation.  PAST SURGICAL HISTORY:  Includes aortic valve replacements, left shoulder rotator cuff surgery, history of prostate cancer, left fifth toe fifth ray amputation, part of fifth metatarsal head removal and an angioplasty to his left leg.    ALLERGIES: Include PENICILLIN.   CURRENT MEDICATIONS:  Include warfarin 7.5 mg Monday, Wednesday, Thursday, Saturday and Sunday, 10 mg on Tuesday and Friday; sodium bicarbonate b.i.d., ranitidine 150 mg daily, iron supplement, Lasix 40 mg b.i.d. and daptomycin 600 mg every 48 hours with his hemodialysis.   SOCIAL HISTORY: Since his foot surgery, he has been somewhat bed bound. No history of smoking or alcohol history.  PHYSICAL EXAMINATION: VITAL SIGNS: Most recent include temperature 98.1, pulse 62, respirations 20, blood pressure 107/58, pulse ox 95. LOWER EXTREMITY:  Examination of the left lower extremity shows a venous stasis-type wound on his left leg. It appears to be superficial. It is approximately 6 x 3 cm with no evidence of purulence drainage. It appears to be fairly superficial. The left foot has had fifth ray amputation. The dorsal incision margin looks like it is fairly well healed with just a little bit of drainage and mild dehiscence. Still has a plantar wound on the left foot which is approximately 2 cm in diameter with fibrotic rim to the area, along with a deeper penetration down to what looks like exposed residual fifth metatarsal area. This is potentially where he gets some pressure when he does stand to walk on it, but I am not sure how much he has been doing that. He does not remember having angioplasty on his left leg, but I will follow through with [facility] and see what their findings are and what their notes say with him. I will go ahead and pad and redress the area  today. I will have him try to stay nonweightbearing with this, get a consult for [facility]. I will discuss this case with Dr. Ether Griffins and we will continue to follow him while he is in the hospital   ____________________________ Rhona Raider. Yanelli Zapanta, DPM mgt:ce D: 04/13/2014 18:15:16 ET T: 04/13/2014 18:45:16 ET JOB#: [id number]  cc: Rhona Raider Diksha Tagliaferro, DPM, <Dictator> Epimenio Sarin MD ELECTRONICALLY SIGNED 04/16/2014 7:59

## 2015-02-20 NOTE — Consult Note (Signed)
Brief Consult Note: Patient was seen by consultant.   Consult note dictated.  Electronic Signatures: Riki AltesStoioff, Scott C (MD)  (Signed 15-Jun-15 18:06)  Authored: Brief Consult Note   Last Updated: 15-Jun-15 18:06 by Riki AltesStoioff, Scott C (MD)

## 2015-02-20 NOTE — Consult Note (Signed)
Chief Complaint:  Subjective/Chief Complaint seen for GI bleeding.  reported black stools this am.  cardiac irregularities this am (VT) hemodynamically stable, denies abdominal pain and nausea.   VITAL SIGNS/ANCILLARY NOTES: **Vital Signs.:   25-Jun-15 14:10  Vital Signs Type per nurse  Temperature Temperature (F) 98.5  Celsius 36.9  Pulse Pulse 62  Respirations Respirations 20  Systolic BP Systolic BP 98  Diastolic BP (mmHg) Diastolic BP (mmHg) 53  Mean BP 68  Pulse Ox % Pulse Ox % 94  Pulse Ox Activity Level  At rest  Oxygen Delivery Room Air/ 21 %  *Intake and Output.:   25-Jun-15 06:45  Stool  Ex large black loose    10:43  Stool  Large loose, black bowel movement.    15:30  Stool  Large black watery bowel movement.    18:15  Stool  Large loose, black stool   Brief Assessment:  Cardiac Irregular   Respiratory rhonchi   Gastrointestinal details normal distended, bs positive, non tender.   Additional Physical Exam DRE-stool is dark green, not black. loose.   Lab Results: Routine Chem:  25-Jun-15 14:27   Glucose, Serum  151  BUN  26  Creatinine (comp)  2.85  Sodium, Serum 137  Potassium, Serum 3.8  Chloride, Serum 100  CO2, Serum 30  Calcium (Total), Serum 8.5  Anion Gap 7  Osmolality (calc) 281  eGFR (African American)  23  eGFR (Non-African American)  20 (eGFR values <65mL/min/1.73 m2 may be an indication of chronic kidney disease (CKD). Calculated eGFR is useful in patients with stable renal function. The eGFR calculation will not be reliable in acutely ill patients when serum creatinine is changing rapidly. It is not useful in  patients on dialysis. The eGFR calculation may not be applicable to patients at the low and high extremes of body sizes, pregnant women, and vegetarians.)  Result Comment LABS - This specimen was collected through an   - indwelling catheter or arterial line.  - A minimum of 72mls of blood was wasted prior    - to collecting  the sample.  Interpret  - results with caution. POTASSIUM/MG - Slight hemolysis, interpret results with  - caution.  Result(s) reported on 23 Apr 2014 at 03:42PM.  Magnesium, Serum 1.8 (1.8-2.4 THERAPEUTIC RANGE: 4-7 mg/dL TOXIC: > 10 mg/dL  -----------------------)  Routine Hem:  14-Jun-15 12:37   Hemoglobin (CBC)  5.4    13:28   Hemoglobin (CBC)  5.4  15-Jun-15 03:53   Hemoglobin (CBC)  6.6  16-Jun-15 07:37   Hemoglobin (CBC)  7.8    14:00   Hemoglobin (CBC)  7.9  17-Jun-15 03:38   Hemoglobin (CBC)  7.8  18-Jun-15 03:50   Hemoglobin (CBC)  7.4  19-Jun-15 04:33   Hemoglobin (CBC)  6.7    10:14   Hemoglobin (CBC)  7.4 (Result(s) reported on 17 Apr 2014 at 10:47AM.)    16:32   Hemoglobin (CBC)  6.9  20-Jun-15 01:52   Hemoglobin (CBC)  7.5    11:09   Hemoglobin (CBC)  7.4 (Result(s) reported on 18 Apr 2014 at 11:26AM.)    16:10   Hemoglobin (CBC)  7.3  21-Jun-15 04:49   Hemoglobin (CBC)  8.0  22-Jun-15 04:22   Hemoglobin (CBC)  8.2    20:15   Hemoglobin (CBC)  8.4  23-Jun-15 05:23   Hemoglobin (CBC)  7.7    17:54   Hemoglobin (CBC)  7.3    21:58   Hemoglobin (CBC)  7.4  24-Jun-15 05:14   Hemoglobin (CBC)  7.7  25-Jun-15 06:45   WBC (CBC) 6.6  RBC (CBC)  2.71  Hemoglobin (CBC)  7.6  Hematocrit (CBC)  23.9  Platelet Count (CBC)  64  MCV 88  MCH 28.0  MCHC  31.9  RDW  18.6  Neutrophil % 75.4  Lymphocyte % 13.6  Monocyte % 8.8  Eosinophil % 1.6  Basophil % 0.6  Neutrophil # 5.0  Lymphocyte #  0.9  Monocyte # 0.6  Eosinophil # 0.1  Basophil # 0.0 (Result(s) reported on 23 Apr 2014 at 07:33AM.)   Radiology Results: CT:    25-Jun-15 01:50, CT Abdomen and Pelvis Without Contrast  CT Abdomen and Pelvis Without Contrast   REASON FOR EXAM:    (1) Abd distenstion, gi bleed; (2) Abd distenstion,   gi bleed;    NOTE: Nursing t  COMMENTS:       PROCEDURE: CT  - CT ABDOMEN AND PELVIS W0  - Apr 23 2014  1:50AM     CLINICAL DATA:  Gastrointestinal  bleeding. Multiple medical issues  including prostate cancer due, diabetes, hypertension, diastolic  heart failure. Abdominal distention.    EXAM:  CT ABDOMEN AND PELVIS WITHOUT CONTRAST    TECHNIQUE:  Multidetector CT imaging of the abdomen and pelvis was performed  following the standard protocol without IV contrast.    COMPARISON:  04/12/2014    FINDINGS:  Bilateral pleural effusions with bibasilar atelectasis. Contrast  medium in the distal esophagus compatible with gastroesophageal  reflux.    Low-density blood pool. Cardiomegaly. Subcutaneous edema. Prominent  ascites.    The noncontrast CT appearance of the liver, spleen, pancreas, and  adrenal glands is within normal limits. Small gallstone noted in the  gallbladder.  Atrophic kidneys, no renal calculi or hydronephrosis.    Aortoiliac atherosclerotic vascular disease. No pathologic upper  abdominal adenopathy is observed. Orally administered contrast  extends through to the rectum.    Small inguinal lymph nodes are not pathologically enlarged by size  criteria. Urinary bladder unremarkable. Ill definition of the  prostate bed. Prominent lumbar and thoracic spondylosis with  multilevel interbody bridging spurring.     IMPRESSION:  1. The hyperdense focus in the urinary bladder has resolved.  Accordingly this was likely blood products.  2. Diffuse subcutaneous edema with extensive ascites and small  bilateral pleural effusions, compatible with third spacing of fluid.  3. Cardiomegaly.  Low-density blood pool favors anemia.  4. Small gallstone.  5. Atherosclerosis.  6. Stable prominent lumbar and thoracic spondylosis.  7. Gastroesophageal reflux.      Electronically Signed    By: Sherryl Barters M.D.    On: 04/23/2014 01:58         Verified By: Carron Curie, M.D.,   Assessment/Plan:  Assessment/Plan:  Assessment 1) melena-not recurrent, hemodynamically and hgb stable.  No clotty marroon rectal  bleeding today (likely hemorrhoidal) 2)  Multiple medical issues with artificial heart valve, AF, CKD, h/o prostate Ca, hematuria   Plan 1) continue current.  continue full liquid diet for another day before moving to low residue.  Would recommend using daily miralax once solid foods started. Will discuss Pelvic ct changes with PMD tomorrow.   Electronic Signatures: Loistine Simas (MD)  (Signed 25-Jun-15 22:07)  Authored: Chief Complaint, VITAL SIGNS/ANCILLARY NOTES, Brief Assessment, Lab Results, Radiology Results, Assessment/Plan   Last Updated: 25-Jun-15 22:07 by Loistine Simas (MD)

## 2015-02-20 NOTE — Consult Note (Signed)
PATIENT NAME:  Daniel Pennington, MUSIAL MR#:  [id number] DATE OF BIRTH:  1932-08-09  DATE OF CONSULTATION:  02/23/2014  REQUESTING PHYSICIAN:  Sital P. Benjie Karvonen, MD CONSULTING PHYSICIAN:  Cheral Marker. Ola Spurr, MD  REASON FOR CONSULTATION:  Diabetic foot infection and osteomyelitis.  HISTORY OF PRESENT ILLNESS:  This is a 81 year old poorly controlled diabetic with peripheral disease admitted April 25th with chronic worsening ulceration of his left foot with foul-smelling discharge. Patient had been usually followed at the [facility] in [place]. He was following with wound care there apparently. In the ER here, he was found to have x-ray showing distal 5th metatarsal osteomyelitis from open wound. The patient was admitted and underwent surgical resection of the infected bone on April 26th with amputation of the left 5th ray and implantation of vancomycin and tobramycin beads by Dr. Vickki Muff. He then underwent today attempts to revascularization by Dr. Lucky Cowboy. He was then covered with broad-spectrum antibiotics.   PAST MEDICAL HISTORY:  1. Diabetes.  2. Peripheral vascular disease.  3. Chronic foot ulcerations.  4. Hypertension.  5. Chronic kidney disease.  6. Congestive heart failure.  7. GERD. 8. Prostate cancer.  9. Aortic valve replacement, metallic.  10. Rotator cuff surgery.  11. CABG.   SOCIAL HISTORY: The patient ambulates with a walker at home.  He does not smoke, drink, or use drugs.   FAMILY HISTORY: Positive for hypertension and kidney disease.   ALLERGIES: ALLERGIC TO PENICILLIN.   REVIEW OF SYSTEMS: Eleven systems reviewed and negative except as per HPI.   ANTIBIOTICS: Currently, he is on vancomycin and meropenem.   PHYSICAL EXAMINATION:  VITAL SIGNS: Temperature 97.5, pulse 48, blood pressure 115/65, respirations 18, saturation 100%.  He is obese. He is in respiratory distress in the CCU on 100% nonrebreather.  HEENT: Pupils are reactive. Extraocular movements are intact. Sclerae are anicteric.  Oropharynx is clear.  NECK: Supple.  HEART:  Distant heart sounds.  LUNGS: With poor air movement bilaterally.  ABDOMEN: Distended and tympanic.  EXTREMITIES: He has a graft postoperatively. NEUROLOGICAL:  He is alert and oriented.   LABORATORY DATA: White blood count on admission was 6, currently 6.9. Hemoglobin 8.3, platelets 172,000. Renal function shows creatinine of 2.61. LFTs done 04/25 showed low albumin at 2.9, alkaline phosphatase is 128. Blood cultures x 2 April 25th negative.  Foot wound cultures April 26th show heavy growth of staph aureus, sensitivities pending.   IMAGING: X-ray of the foot April 25th on the left showed osteomyelitis involving the distal 5th metatarsal and possibly the distal 4th metatarsal and distal 3rd and 2nd possibly.   IMPRESSION: A 81 year old with diabetes, peripheral vascular disease, osteomyelitis of his left metatarsal status post amputation 1. The patient does have a PENICILLIN ALLERGY. 2. We will check an ESR and a CRP today to monitor. 3. Patient will need 6 weeks of IV antibiotics, likely.  Could consider shorter course depending on his response and followup evaluation of his wound.  4. I will be glad to follow the patient as an outpatient as needed.   Thank you for the consult.   ____________________________ Cheral Marker. Ola Spurr, MD dpf:dd D: 02/23/2014 21:41:38 ET T: 02/23/2014 23:08:04 ET JOB#: [id number]  cc: Cheral Marker. Ola Spurr, MD, <Dictator> DAVID Ola Spurr MD ELECTRONICALLY SIGNED 03/01/2014 21:12

## 2015-02-20 NOTE — Consult Note (Signed)
Brief Consult Note: Diagnosis: Urinary retention/hematuria.   Patient was seen by consultant.   Consult note dictated.   Recommend to proceed with surgery or procedure.   Orders entered.   Discussed with Attending MD.   Comments: Continue CBI to maintain clear output. Avoid anticoagulation if possible. Romove Foley when ambulatory.  Electronic Signatures: Orson ApeWolff, Leilene Diprima R (MD)  (Signed [id number]-May-15 18:49)  Authored: Brief Consult Note   Last Updated: 05-May-15 18:49 by Orson ApeWolff, Kamden Reber R (MD)

## 2015-02-20 NOTE — Consult Note (Signed)
chart has been reviewed.has been evaluated. examination is unchanged Diagnosis: Osteomyelitis/Renal Failure/Thrombocytopenia.   Comments: Platelt count halved over past 3-4 days.recommend discontinuing heparin products.does need anticoagulation due to vascular compromise as well as previous AVR. Would recommend Argatroban to start at 0.485mcg/kg/min with goal of PTT to 1.5-3 times normal not to exceed PTT of 100.any active bleeding would transfuse platelets. time has not been done and will be repeated tomorrow.function is improvingcount is stable with 43,000 andis stableacute bleeding  pro time tomorrow.bleeding is stabilized than possibility of and anticoagulation with  argatroban in previously mentioned does schedule should be given.  Electronic Signatures: Laddie Aquashoksi, Janak K (MD)  (Signed on 20-Jun-15 15:06)  Authored  Last Updated: 20-Jun-15 15:06 by Laddie Aquashoksi, Janak K (MD)

## 2015-02-20 NOTE — Consult Note (Signed)
Chief Complaint:  Subjective/Chief Complaint Please see full EGD report.  Ulcer in the distal esophagus.  Ulcer in the hiatal hernia with stigmata of recent bleeding but no visible vessel.  Multiple gastric polyps, without stigmata of bleeding. One AVM on the distal greater curvature of the gastric body, ablated.   Diffuse gastritis, atrophic?    Continue iv ppi drip for today then change to bid iv for a few days before continuing bid po.  Carafate liquid.  Hold anticoagulation as long as clinically feasible before restarting.  Discussed with Dr Juliene PinaMody.   Electronic Signatures: Barnetta ChapelSkulskie, Martin (MD)  (Signed 22-Jun-15 15:49)  Authored: Chief Complaint   Last Updated: 22-Jun-15 15:49 by Barnetta ChapelSkulskie, Martin (MD)

## 2015-02-20 NOTE — Consult Note (Signed)
If ok for anesthesia will proceed with cystoscopy/bilat retrograde pyelograms. All questions were answered and he desires to proceed.  Electronic Signatures: Riki AltesStoioff, Scott C (MD)  (Signed on 17-Jun-15 07:30)  Authored  Last Updated: 17-Jun-15 07:30 by Riki AltesStoioff, Scott C (MD)

## 2015-02-20 NOTE — Consult Note (Signed)
Pt feeling better, tol diet well, no bleeding from bowels today, VSS afebrile, hgb 7.9 today, off coumadin, Chest shows clear air flow anterior, heart 2-3/6 systolic murmur.EGD reviewed from 6/22, AVM and small ulcer.  Should be able to advance diet soon and home soon.  Cardiology to determine anticoagulant for his aortic valve.  Electronic Signatures: Scot JunElliott, Zain Lankford T (MD)  (Signed on 27-Jun-15 16:04)  Authored  Last Updated: 27-Jun-15 16:04 by Scot JunElliott, Aarianna Hoadley T (MD)

## 2015-02-20 NOTE — Consult Note (Signed)
Chief Complaint:  Subjective/Chief Complaint seen for melena/GI bleed.  denies abdominal pain or nausea.  several episodes of dark clotted blood from rectum.   VITAL SIGNS/ANCILLARY NOTES: **Vital Signs.:   23-Jun-15 19:59  Vital Signs Type Routine  Temperature Temperature (F) 97.6  Celsius 36.4  Pulse Pulse 60  Respirations Respirations 20  Systolic BP Systolic BP 91  Diastolic BP (mmHg) Diastolic BP (mmHg) 59  Mean BP 69  Pulse Ox % Pulse Ox % 100  Pulse Ox Activity Level  At rest  Oxygen Delivery Non Rebreather Mask   Brief Assessment:  Cardiac Irregular   Respiratory clear BS   Gastrointestinal details normal distended, bowel sounds positive, nontender.   Additional Physical Exam DRE- enlargement of tessue anterior to rectum with posterior crowding of the rectum.  rectum with dark clots.  non-tender.   Lab Results: Routine BB:  23-Jun-15 11:45   Platelets (Blood Component) -  ABO Group + Rh Type O Positive  Antibody Screen NEGATIVE (Result(s) reported on 21 Apr 2014 at 02:15PM.)    12:59   Platelets (Blood Component) -  Routine Chem:  23-Jun-15 05:23   Result Comment labs - This specimen was collected through an   - indwelling catheter or arterial line.  - A minimum of 70ms of blood was wasted prior    - to collecting the sample.  Interpret  - results with caution.  Result(s) reported on 21 Apr 2014 at 06:14AM.  Glucose, Serum  121  BUN  29  Creatinine (comp)  2.24  Sodium, Serum 142  Potassium, Serum 3.9  Chloride, Serum 104  CO2, Serum 31  Calcium (Total), Serum  8.4  Anion Gap 7  Osmolality (calc) 290  eGFR (African American)  31  eGFR (Non-African American)  27 (eGFR values <670mmin/1.73 m2 may be an indication of chronic kidney disease (CKD). Calculated eGFR is useful in patients with stable renal function. The eGFR calculation will not be reliable in acutely ill patients when serum creatinine is changing rapidly. It is not useful in  patients on  dialysis. The eGFR calculation may not be applicable to patients at the low and high extremes of body sizes, pregnant women, and vegetarians.)    11:45   Result Comment PLATELETS - DUPLICATE ORDER. -PBP  Result(s) reported on 21 Apr 2014 at 11:58AM.    12:59   Result Comment PLATELETS - DUPLICATE ORDER  Result(s) reported on 21 Apr 2014 at 01:02PM.    17:54   Result Comment - This specimen was collected through an   - indwelling catheter or arterial line.  - A minimum of 61m68mof blood was wasted prior    - to collecting the sample.  Interpret  - results with caution.  Result(s) reported on 21 Apr 2014 at 06:26PM.  Routine Coag:  23-Jun-15 05:23   Prothrombin  18.1  INR 1.5 (INR reference interval applies to patients on anticoagulant therapy. A single INR therapeutic range for coumarins is not optimal for all indications; however, the suggested range for most indications is 2.0 - 3.0. Exceptions to the INR Reference Range may include: Prosthetic heart valves, acute myocardial infarction, prevention of myocardial infarction, and combinations of aspirin and anticoagulant. The need for a higher or lower target INR must be assessed individually. Reference: The Pharmacology and Management of the Vitamin K  antagonists: the seventh ACCP Conference on Antithrombotic and Thrombolytic Therapy. CheSPQZR.0076pt:126 (3suppl): [id number] HCT value >55% may artifactually increase the PT.  In one study,  the increase was an average of 25%. Reference:  "Effect on Routine and Special Coagulation Testing Values of Citrate Anticoagulant Adjustment in Patients with High HCT Values." American Journal of Clinical Pathology 2006;126:400-405.)  Routine Hem:  23-Jun-15 05:23   Hemoglobin (CBC)  7.7  WBC (CBC)  11.3  RBC (CBC)  2.79  Hematocrit (CBC)  24.6  Platelet Count (CBC)  40  MCV 88  MCH 27.6  MCHC  31.4  RDW  18.1  Neutrophil % 86.4  Lymphocyte % 5.9  Monocyte % 7.1  Eosinophil % 0.2   Basophil % 0.4  Neutrophil #  9.7  Lymphocyte #  0.7  Monocyte # 0.8  Eosinophil # 0.0  Basophil # 0.0    17:54   Hemoglobin (CBC)  7.3   Radiology Results: CT:    14-Jun-15 17:02, CT Abdomen and Pelvis Without Contrast  CT Abdomen and Pelvis Without Contrast   REASON FOR EXAM:    (1) abdominal distention; (2) abdonimal distention  COMMENTS:       PROCEDURE: CT  - CT ABDOMEN AND PELVIS W0  - Apr 12 2014  5:02PM     CLINICAL DATA:  Abdominal distention. Shortness of breath. Chronic  renal insufficiency. The patient has not had dialysis and 6 days.    EXAM:  CT ABDOMEN AND PELVIS WITHOUT CONTRAST    TECHNIQUE:  Multidetector CT imaging of the abdomen and pelvis was performed  following the standard protocol without IV contrast.  COMPARISON:  Abdominal ultrasound for ascites 02/24/2014    FINDINGS:  Heart is mildly enlarged. The vascular chambers are hypodense  suggesting anemia. Coronary artery calcifications are evident. A a  small right pleural effusion is present. Bibasilar atelectasis is  evident.    A large amount of abdominal ascites is present. The liver is within  normal limits. Granulomatous changes are present within the spleen.  The stomach, duodenum, and pancreas are unremarkable. The common  bile duct and gallbladder are normal. The adrenal glands are normal  bilaterally. The kidneys are somewhat atrophic no focal lesion or  obstruction is present.  No hemorrhage or mass is present at the bladder base. Urinary  bladder is otherwise unremarkable.    Atherosclerotic calcifications are present throughout the abdominal  aorta without aneurysm. Diffuse edematous changes of the  subcutaneous tissues is compatible with anasarca. Enlarged inguinal  lymph nodes are present bilaterally, likely related to the edema. No  significant intraabdominal adenopathy is present.    The bone windows demonstrate ankylosis through the thoracolumbar  spine. There is  straightening of the normal lumbar lordosis.     IMPRESSION:  1. A large amount of abdominal ascites and diffuse subcutaneous  edemacompatible with anasarca.  2. Small to moderate right pleural effusion at associated  atelectasis.  3. 2.2 x 4.2 cm hyperdense focus within the bladder. This most  likely represents focal hemorrhage. A mass lesion is considered less  likely.  4. Bilateral inguinal lymph nodes are likely related to the lower  extremity edema.  5. Ankylosis through the thoracolumbar spine with straightening of  the normal lumbar lordosis.      Electronically Signed    By: Lawrence Santiago M.D.    On: 04/12/2014 17:16    Verified By: Resa Miner. MATTERN, M.D.,   Assessment/Plan:  Assessment/Plan:  Assessment 1) GI bleeding.  EGD yesterday negative for active bleeding although portential previous sources identifies including Gastric ulcer (with some stigmata of recent bleeding) and AVM (cauterized).  However bleedign noted  on exam this evening more consistant with lower bleedign than upper, even moreso like anal outlet bleeding.   DRE shows large impingement on the rectal vault anteriorly, possibly of the bladder.   2) Multiple medical issues with artificial heart valve, AF, CKD, h/o prostate Ca, hematuria   Plan 1) continue serial hgb, tfz as needed.  2) CT abdomen and pelvis with oral contrast  Will consider flex sig versus colonoscopy after above. Thrombocytopenia an issue , possisibly due to consumption.   Electronic Signatures: Loistine Simas (MD)  (Signed 23-Jun-15 21:28)  Authored: Chief Complaint, VITAL SIGNS/ANCILLARY NOTES, Brief Assessment, Lab Results, Radiology Results, Assessment/Plan   Last Updated: 23-Jun-15 21:28 by Loistine Simas (MD)

## 2015-02-20 NOTE — Op Note (Signed)
PATIENT NAME:  CAPTAIN, BLUCHER MR#:  [id number] DATE OF BIRTH:  1932-04-03  DATE OF PROCEDURE:  02/23/2014  PREOPERATIVE DIAGNOSES:  1.  Peripheral arterial disease with ulceration and osteomyelitis, left lower extremity.  2.  Obesity.  3.  Hypertension.  4.  Chronic kidney disease, stage IV.  POSTOPERATIVE DIAGNOSES: 1.  Peripheral arterial disease with ulceration and osteomyelitis, left lower extremity.  2.  Obesity.  3.  Hypertension.  4.  Chronic kidney disease, stage IV.  PROCEDURE: 1.  Ultrasound guidance for vascular access, right femoral artery.  2.  Catheter placement to left dorsalis pedis artery from right femoral approach.  3.  Aortogram and selective left lower extremity angiogram.  4.  Percutaneous transluminal angioplasty of left anterior tibial artery and dorsalis pedis artery with 2.5 and 3 mm diameter angioplasty balloon.  5.  StarClose closure device, right femoral artery.   SURGEON: Festus Barren, M.D.   ANESTHESIA: Local with moderate conscious sedation.   ESTIMATED BLOOD LOSS: 25 mL.  FLUOROSCOPY TIME: 12.8 minutes.   CONTRAST USED: 60 mL.   INDICATION FOR PROCEDURE: This is a 81 year old African American male, who was admitted over the weekend with osteomyelitis of the left foot with a severe infected ulceration. He has undergone surgical debridement. We are performing an angiogram for further evaluation and potential revascularization to improve his perfusion for wound healing. Risks and benefits were discussed. Informed consent was obtained.   DESCRIPTION OF PROCEDURE: The patient is brought to the vascular suite. Groins were shaved and prepped and a sterile surgical field was created. The right femoral artery was visualized with ultrasound and found to be widely patent. It was then accessed under direct ultrasound guidance without difficulty with a Seldinger needle. A J-wire and 5-French sheath were then placed. Pigtail catheter was placed in the aorta and AP  aortogram was performed. This showed patent renal arteries, patent aorta and iliac segments without flow-limiting stenosis. This was markedly tortuous and had a steep aortic bifurcation. I then used a rim catheter and a floppy Glidewire and advanced down with a glide catheter down to the left femoral artery. Selective left lower extremity angiogram was then performed. This showed patent common femoral artery, profunda femoris artery, superficial femoral artery and popliteal artery. There was some mild stenosis in the SFA and popliteal arteries, but nothing more than 20% or 25%; however, at the tibial trifurcation there was severe disease. All 3 tibials were obliterated. There was occlusion over reasonably long segments to the peroneal artery and anterior tibial artery with reconstitution distally. The posterior tibial artery did not appear to reconstitute distally. I heparinized the patient and placed a high flex Ansel sheath over a Terumo Advantage wire. I then used a Kumpe catheter and a CXI catheter and then an Advantage wire as well as a V18 wire.  I initially probed the peroneal artery, but could not cross the occlusion and gain intraluminal flow distally. After several minutes, it was clear that this was not going to be an easy lesion to cross. I then turned my attention to the anterior tibial artery. Despite it being the longer of the 2 occlusions, I was able to cross it and gain intraluminal flow to the anterior tibial artery and dorsalis pedis artery in the foot. I used a 2.5 mm diameter angioplasty balloon in the dorsalis pedis artery and distal anterior tibial artery and then a 3 mm diameter angioplasty balloon from the ankle up into the popliteal artery encompassing the entire anterior tibial artery.  Waists were taken, which resolved with angioplasty. Completion angiogram showed continuous flow in the anterior tibial artery with no greater than 50% residual stenosis into the foot. At this point, I  elected to terminate the procedure. The sheath was pulled back to the ipsilateral external iliac artery and oblique arteriogram was performed. StarClose closure device was deployed in the usual fashion with excellent hemostatic result. The patient tolerated the procedure well and was taken to the recovery room in stable condition.  ____________________________ Jason S. Dew, MD jsd:aw D: 04/Annice Needy27/2015 11:01:55 ET T: 02/23/2014 11:30:14 ET JOB#: [id number]  cc: Annice NeedyJason S. Dew, MD, <Dictator> Annice NeedyJASON S DEW MD ELECTRONICALLY SIGNED 03/05/2014 14:25

## 2015-03-31 DEATH — deceased

## 2016-04-03 IMAGING — CR DG CHEST 1V PORT
1 series · 1 of 1 positions shown · non-contrast
Comparison: 03/04/2014

CLINICAL DATA: Shortness of breath.

EXAM:
PORTABLE CHEST - 1 VIEW

[ap]
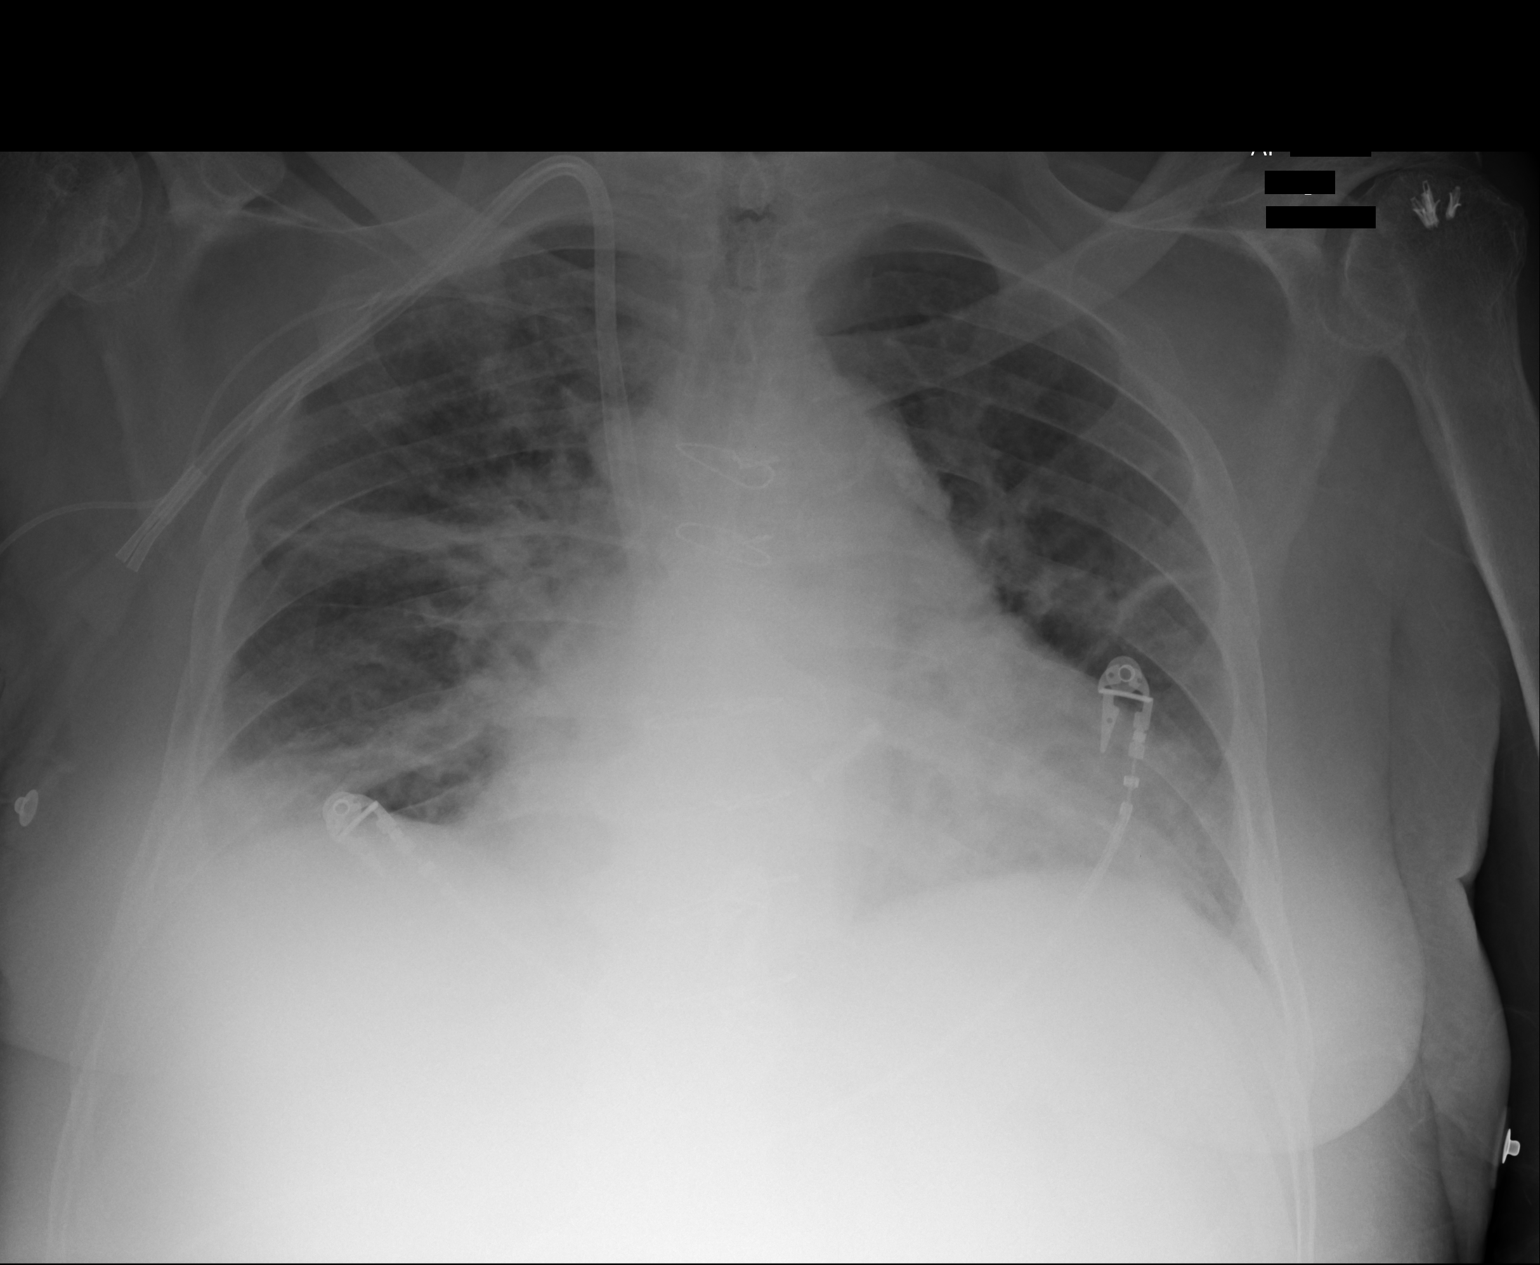

[1 of 1 positions shown; findings below may reference images not displayed]

FINDINGS: There is a right sided dialysis catheter with tips in the cavoatrial
junction and right atrium. Right arm PICC line tip is in the distal
right subclavian vein. Heart size is enlarged. The patient is status
post median sternotomy and CABG procedure. Right upper lobe, right
lower lobe and left midlung atelectasis. Pulmonary vascular
congestion is noted.
IMPRESSION: 1. Bilateral platelike areas of atelectasis noted.
2. Cardiac enlargement and pulmonary vascular congestion.
